# Patient Record
Sex: Male | Born: 1965 | Race: Black or African American | Hispanic: No | Marital: Single | Smoking: Current every day smoker
Health system: Southern US, Community
[De-identification: ages and names within clinical notes are randomized; demographics above are authoritative.]

## PROBLEM LIST (undated history)

## (undated) DIAGNOSIS — I10 Essential (primary) hypertension: Secondary | ICD-10-CM

## (undated) DIAGNOSIS — F319 Bipolar disorder, unspecified: Secondary | ICD-10-CM

## (undated) DIAGNOSIS — F4310 Post-traumatic stress disorder, unspecified: Secondary | ICD-10-CM

## (undated) DIAGNOSIS — K859 Acute pancreatitis without necrosis or infection, unspecified: Secondary | ICD-10-CM

## (undated) HISTORY — PX: CHOLECYSTECTOMY: SHX55

## (undated) HISTORY — PX: PANCREAS SURGERY: SHX731

---

## 2001-04-10 ENCOUNTER — Inpatient Hospital Stay (HOSPITAL_COMMUNITY): Admission: EM | Admit: 2001-04-10 | Discharge: 2001-04-14 | Payer: Self-pay | Admitting: Emergency Medicine

## 2001-04-10 ENCOUNTER — Encounter: Payer: Self-pay | Admitting: Emergency Medicine

## 2003-10-11 ENCOUNTER — Other Ambulatory Visit: Payer: Self-pay

## 2004-02-12 ENCOUNTER — Other Ambulatory Visit: Payer: Self-pay

## 2009-03-10 ENCOUNTER — Emergency Department: Payer: Self-pay | Admitting: Emergency Medicine

## 2009-05-08 ENCOUNTER — Emergency Department: Payer: Self-pay | Admitting: Emergency Medicine

## 2009-12-13 ENCOUNTER — Inpatient Hospital Stay: Payer: Self-pay | Admitting: Internal Medicine

## 2010-01-07 ENCOUNTER — Emergency Department: Payer: Self-pay | Admitting: Unknown Physician Specialty

## 2010-01-20 DIAGNOSIS — F1011 Alcohol abuse, in remission: Secondary | ICD-10-CM | POA: Insufficient documentation

## 2010-01-20 DIAGNOSIS — F1410 Cocaine abuse, uncomplicated: Secondary | ICD-10-CM | POA: Insufficient documentation

## 2011-11-10 ENCOUNTER — Inpatient Hospital Stay: Payer: Self-pay | Admitting: Student

## 2011-11-10 LAB — URINALYSIS, COMPLETE
Bacteria: NONE SEEN
Bilirubin,UR: NEGATIVE
Blood: NEGATIVE
Glucose,UR: NEGATIVE mg/dL (ref 0–75)
Leukocyte Esterase: NEGATIVE
Nitrite: NEGATIVE
Ph: 6 (ref 4.5–8.0)
Protein: 30
RBC,UR: 9 /HPF (ref 0–5)
Specific Gravity: 1.021 (ref 1.003–1.030)
Squamous Epithelial: 1
WBC UR: 2 /HPF (ref 0–5)

## 2011-11-10 LAB — COMPREHENSIVE METABOLIC PANEL
Albumin: 3.9 g/dL (ref 3.4–5.0)
Alkaline Phosphatase: 85 U/L (ref 50–136)
Anion Gap: 10 (ref 7–16)
BUN: 8 mg/dL (ref 7–18)
Bilirubin,Total: 0.7 mg/dL (ref 0.2–1.0)
Calcium, Total: 10.1 mg/dL (ref 8.5–10.1)
Chloride: 92 mmol/L — ABNORMAL LOW (ref 98–107)
Co2: 26 mmol/L (ref 21–32)
Creatinine: 1.04 mg/dL (ref 0.60–1.30)
EGFR (African American): >60
EGFR (Non-African Amer.): >60
Glucose: 112 mg/dL — ABNORMAL HIGH (ref 65–99)
Osmolality: 256 (ref 275–301)
Potassium: 3.3 mmol/L — ABNORMAL LOW (ref 3.5–5.1)
SGOT(AST): 16 U/L (ref 15–37)
SGPT (ALT): 18 U/L
Sodium: 128 mmol/L — ABNORMAL LOW (ref 136–145)
Total Protein: 8.1 g/dL (ref 6.4–8.2)

## 2011-11-10 LAB — CBC
HCT: 43.7 % (ref 40.0–52.0)
HGB: 14.4 g/dL (ref 13.0–18.0)
MCH: 29.9 pg (ref 26.0–34.0)
MCHC: 33 g/dL (ref 32.0–36.0)
MCV: 91 fL (ref 80–100)
Platelet: 292 10*3/uL (ref 150–440)
RBC: 4.82 10*6/uL (ref 4.40–5.90)
RDW: 13.2 % (ref 11.5–14.5)
WBC: 14.6 10*3/uL — ABNORMAL HIGH (ref 3.8–10.6)

## 2011-11-10 LAB — LIPASE, BLOOD: Lipase: 160 U/L (ref 73–393)

## 2011-11-11 LAB — DRUG SCREEN, URINE
Amphetamines, Ur Screen: NEGATIVE (ref ?–1000)
Barbiturates, Ur Screen: NEGATIVE (ref ?–200)
Benzodiazepine, Ur Scrn: NEGATIVE (ref ?–200)
Cannabinoid 50 Ng, Ur ~~LOC~~: POSITIVE (ref ?–50)
Cocaine Metabolite,Ur ~~LOC~~: POSITIVE (ref ?–300)
MDMA (Ecstasy)Ur Screen: NEGATIVE (ref ?–500)
Methadone, Ur Screen: NEGATIVE (ref ?–300)
Opiate, Ur Screen: POSITIVE (ref ?–300)
Phencyclidine (PCP) Ur S: NEGATIVE (ref ?–25)
Tricyclic, Ur Screen: NEGATIVE (ref ?–1000)

## 2011-11-11 LAB — CBC WITH DIFFERENTIAL/PLATELET
Basophil #: 0.1 10*3/uL (ref 0.0–0.1)
Basophil %: 0.4 %
Eosinophil #: 0.3 10*3/uL (ref 0.0–0.7)
Eosinophil %: 2.6 %
HCT: 42 % (ref 40.0–52.0)
HGB: 13.9 g/dL (ref 13.0–18.0)
Lymphocyte #: 2.8 10*3/uL (ref 1.0–3.6)
Lymphocyte %: 21.7 %
MCH: 29.6 pg (ref 26.0–34.0)
MCHC: 33.1 g/dL (ref 32.0–36.0)
MCV: 90 fL (ref 80–100)
Monocyte #: 1.2 x10 3/mm — ABNORMAL HIGH (ref 0.2–1.0)
Monocyte %: 9 %
Neutrophil #: 8.6 10*3/uL — ABNORMAL HIGH (ref 1.4–6.5)
Neutrophil %: 66.3 %
Platelet: 279 10*3/uL (ref 150–440)
RBC: 4.69 10*6/uL (ref 4.40–5.90)
RDW: 13.2 % (ref 11.5–14.5)
WBC: 13 10*3/uL — ABNORMAL HIGH (ref 3.8–10.6)

## 2011-11-11 LAB — MAGNESIUM: Magnesium: 2 mg/dL

## 2011-11-11 LAB — AMYLASE: Amylase: 94 U/L (ref 25–115)

## 2011-11-11 LAB — COMPREHENSIVE METABOLIC PANEL
Albumin: 3.4 g/dL (ref 3.4–5.0)
Alkaline Phosphatase: 77 U/L (ref 50–136)
Anion Gap: 13 (ref 7–16)
BUN: 9 mg/dL (ref 7–18)
Bilirubin,Total: 0.6 mg/dL (ref 0.2–1.0)
Calcium, Total: 9.6 mg/dL (ref 8.5–10.1)
Chloride: 96 mmol/L — ABNORMAL LOW (ref 98–107)
Co2: 25 mmol/L (ref 21–32)
Creatinine: 1.15 mg/dL (ref 0.60–1.30)
EGFR (African American): >60
EGFR (Non-African Amer.): >60
Glucose: 94 mg/dL (ref 65–99)
Osmolality: 267 (ref 275–301)
Potassium: 2.9 mmol/L — ABNORMAL LOW (ref 3.5–5.1)
SGOT(AST): 14 U/L — ABNORMAL LOW (ref 15–37)
SGPT (ALT): 15 U/L
Sodium: 134 mmol/L — ABNORMAL LOW (ref 136–145)
Total Protein: 7.3 g/dL (ref 6.4–8.2)

## 2011-11-11 LAB — LIPASE, BLOOD: Lipase: 149 U/L (ref 73–393)

## 2011-11-12 LAB — BASIC METABOLIC PANEL
Anion Gap: 7 (ref 7–16)
BUN: 10 mg/dL (ref 7–18)
Calcium, Total: 8.8 mg/dL (ref 8.5–10.1)
Chloride: 107 mmol/L (ref 98–107)
Co2: 24 mmol/L (ref 21–32)
Creatinine: 1.18 mg/dL (ref 0.60–1.30)
EGFR (African American): >60
EGFR (Non-African Amer.): >60
Glucose: 100 mg/dL — ABNORMAL HIGH (ref 65–99)
Osmolality: 275 (ref 275–301)
Potassium: 4.4 mmol/L (ref 3.5–5.1)
Sodium: 138 mmol/L (ref 136–145)

## 2013-01-25 DIAGNOSIS — F1910 Other psychoactive substance abuse, uncomplicated: Secondary | ICD-10-CM | POA: Insufficient documentation

## 2013-01-25 DIAGNOSIS — G894 Chronic pain syndrome: Secondary | ICD-10-CM | POA: Insufficient documentation

## 2013-01-25 DIAGNOSIS — K219 Gastro-esophageal reflux disease without esophagitis: Secondary | ICD-10-CM | POA: Insufficient documentation

## 2013-04-05 ENCOUNTER — Inpatient Hospital Stay: Payer: Self-pay | Admitting: Student

## 2013-04-05 LAB — URINALYSIS, COMPLETE
Bacteria: NONE SEEN
Bilirubin,UR: NEGATIVE
Blood: NEGATIVE
Glucose,UR: NEGATIVE mg/dL (ref 0–75)
Leukocyte Esterase: NEGATIVE
Nitrite: NEGATIVE
Ph: 5 (ref 4.5–8.0)
Protein: 30
RBC,UR: 3 /HPF (ref 0–5)
Specific Gravity: 1.026 (ref 1.003–1.030)
Squamous Epithelial: 1
WBC UR: 1 /HPF (ref 0–5)

## 2013-04-05 LAB — CBC
HCT: 40.7 % (ref 40.0–52.0)
HGB: 13.3 g/dL (ref 13.0–18.0)
MCH: 26 pg (ref 26.0–34.0)
MCHC: 32.7 g/dL (ref 32.0–36.0)
MCV: 80 fL (ref 80–100)
Platelet: 202 10*3/uL (ref 150–440)
RBC: 5.12 10*6/uL (ref 4.40–5.90)
RDW: 21.2 % — ABNORMAL HIGH (ref 11.5–14.5)
WBC: 9.2 10*3/uL (ref 3.8–10.6)

## 2013-04-05 LAB — APTT
Activated PTT: 27.7 secs (ref 23.6–35.9)
Activated PTT: 48.1 secs — ABNORMAL HIGH (ref 23.6–35.9)

## 2013-04-05 LAB — COMPREHENSIVE METABOLIC PANEL
Albumin: 3.1 g/dL — ABNORMAL LOW (ref 3.4–5.0)
Alkaline Phosphatase: 87 U/L (ref 50–136)
Anion Gap: 6 — ABNORMAL LOW (ref 7–16)
BUN: 7 mg/dL (ref 7–18)
Bilirubin,Total: 0.3 mg/dL (ref 0.2–1.0)
Calcium, Total: 8.7 mg/dL (ref 8.5–10.1)
Chloride: 109 mmol/L — ABNORMAL HIGH (ref 98–107)
Co2: 24 mmol/L (ref 21–32)
Creatinine: 1.49 mg/dL — ABNORMAL HIGH (ref 0.60–1.30)
EGFR (African American): >60
EGFR (Non-African Amer.): 54 — ABNORMAL LOW
Glucose: 113 mg/dL — ABNORMAL HIGH (ref 65–99)
Osmolality: 276 (ref 275–301)
Potassium: 3.2 mmol/L — ABNORMAL LOW (ref 3.5–5.1)
SGOT(AST): 23 U/L (ref 15–37)
SGPT (ALT): 25 U/L (ref 12–78)
Sodium: 139 mmol/L (ref 136–145)
Total Protein: 6.7 g/dL (ref 6.4–8.2)

## 2013-04-05 LAB — DRUG SCREEN, URINE
Amphetamines, Ur Screen: NEGATIVE (ref ?–1000)
Barbiturates, Ur Screen: NEGATIVE (ref ?–200)
Benzodiazepine, Ur Scrn: NEGATIVE (ref ?–200)
Cannabinoid 50 Ng, Ur ~~LOC~~: POSITIVE (ref ?–50)
Cocaine Metabolite,Ur ~~LOC~~: POSITIVE (ref ?–300)
MDMA (Ecstasy)Ur Screen: NEGATIVE (ref ?–500)
Methadone, Ur Screen: NEGATIVE (ref ?–300)
Opiate, Ur Screen: NEGATIVE (ref ?–300)
Phencyclidine (PCP) Ur S: NEGATIVE (ref ?–25)
Tricyclic, Ur Screen: NEGATIVE (ref ?–1000)

## 2013-04-05 LAB — CK TOTAL AND CKMB (NOT AT ARMC)
CK, Total: 72 U/L (ref 35–232)
CK, Total: 58 U/L (ref 35–232)
CK-MB: 0.9 ng/mL (ref 0.5–3.6)
CK-MB: 1 ng/mL (ref 0.5–3.6)

## 2013-04-05 LAB — PROTIME-INR
INR: 0.9
Prothrombin Time: 12.6 secs (ref 11.5–14.7)

## 2013-04-05 LAB — LIPASE, BLOOD: Lipase: 160 U/L (ref 73–393)

## 2013-04-05 LAB — TROPONIN I
Troponin-I: 0.12 ng/mL — ABNORMAL HIGH
Troponin-I: 0.12 ng/mL — ABNORMAL HIGH

## 2013-04-06 LAB — CK TOTAL AND CKMB (NOT AT ARMC)
CK, Total: 48 U/L (ref 35–232)
CK-MB: 0.8 ng/mL (ref 0.5–3.6)

## 2013-04-06 LAB — CREATININE, SERUM
Creatinine: 1.42 mg/dL — ABNORMAL HIGH (ref 0.60–1.30)
Creatinine: 1.34 mg/dL — ABNORMAL HIGH (ref 0.60–1.30)
EGFR (African American): >60
EGFR (African American): >60
EGFR (Non-African Amer.): 58 — ABNORMAL LOW
EGFR (Non-African Amer.): >60

## 2013-04-06 LAB — LIPID PANEL
Cholesterol: 142 mg/dL (ref 0–200)
HDL Cholesterol: 97 mg/dL — ABNORMAL HIGH (ref 40–60)
Ldl Cholesterol, Calc: 30 mg/dL (ref 0–100)
Triglycerides: 77 mg/dL (ref 0–200)
VLDL Cholesterol, Calc: 15 mg/dL (ref 5–40)

## 2013-04-06 LAB — APTT
Activated PTT: 44.8 secs — ABNORMAL HIGH (ref 23.6–35.9)
Activated PTT: 27.9 secs (ref 23.6–35.9)

## 2013-04-06 LAB — TROPONIN I: Troponin-I: 0.12 ng/mL — ABNORMAL HIGH

## 2013-11-22 ENCOUNTER — Emergency Department: Payer: Self-pay | Admitting: Emergency Medicine

## 2013-11-22 LAB — DRUG SCREEN, URINE
Amphetamines, Ur Screen: NEGATIVE (ref ?–1000)
Barbiturates, Ur Screen: NEGATIVE (ref ?–200)
Benzodiazepine, Ur Scrn: NEGATIVE (ref ?–200)
Cannabinoid 50 Ng, Ur ~~LOC~~: POSITIVE (ref ?–50)
Cocaine Metabolite,Ur ~~LOC~~: POSITIVE (ref ?–300)
MDMA (Ecstasy)Ur Screen: NEGATIVE (ref ?–500)
Methadone, Ur Screen: NEGATIVE (ref ?–300)
Opiate, Ur Screen: NEGATIVE (ref ?–300)
Phencyclidine (PCP) Ur S: NEGATIVE (ref ?–25)
Tricyclic, Ur Screen: NEGATIVE (ref ?–1000)

## 2013-11-22 LAB — COMPREHENSIVE METABOLIC PANEL
Albumin: 3.5 g/dL (ref 3.4–5.0)
Alkaline Phosphatase: 82 U/L
Anion Gap: 3 — ABNORMAL LOW (ref 7–16)
BUN: 11 mg/dL (ref 7–18)
Bilirubin,Total: 0.4 mg/dL (ref 0.2–1.0)
Calcium, Total: 8.9 mg/dL (ref 8.5–10.1)
Chloride: 115 mmol/L — ABNORMAL HIGH (ref 98–107)
Co2: 23 mmol/L (ref 21–32)
Creatinine: 1.52 mg/dL — ABNORMAL HIGH (ref 0.60–1.30)
EGFR (African American): >60
EGFR (Non-African Amer.): 53 — ABNORMAL LOW
Glucose: 84 mg/dL (ref 65–99)
Osmolality: 280 (ref 275–301)
Potassium: 4.5 mmol/L (ref 3.5–5.1)
SGOT(AST): 40 U/L — ABNORMAL HIGH (ref 15–37)
SGPT (ALT): 29 U/L (ref 12–78)
Sodium: 141 mmol/L (ref 136–145)
Total Protein: 7.5 g/dL (ref 6.4–8.2)

## 2013-11-22 LAB — CBC
HCT: 48.1 % (ref 40.0–52.0)
HGB: 15.5 g/dL (ref 13.0–18.0)
MCH: 30.3 pg (ref 26.0–34.0)
MCHC: 32.2 g/dL (ref 32.0–36.0)
MCV: 94 fL (ref 80–100)
Platelet: 196 10*3/uL (ref 150–440)
RBC: 5.12 10*6/uL (ref 4.40–5.90)
RDW: 16.8 % — ABNORMAL HIGH (ref 11.5–14.5)
WBC: 6.6 10*3/uL (ref 3.8–10.6)

## 2013-11-22 LAB — URINALYSIS, COMPLETE
Bacteria: NONE SEEN
Bilirubin,UR: NEGATIVE
Blood: NEGATIVE
Glucose,UR: NEGATIVE mg/dL (ref 0–75)
Hyaline Cast: 2
Ketone: NEGATIVE
Leukocyte Esterase: NEGATIVE
Nitrite: NEGATIVE
Ph: 5 (ref 4.5–8.0)
Protein: NEGATIVE
RBC,UR: 1 /HPF (ref 0–5)
Specific Gravity: 1.018 (ref 1.003–1.030)
Squamous Epithelial: <1
WBC UR: <1 /HPF (ref 0–5)

## 2013-11-22 LAB — ETHANOL
Ethanol %: <0.003 % (ref 0.000–0.080)
Ethanol: <3 mg/dL

## 2013-11-22 LAB — ACETAMINOPHEN LEVEL: Acetaminophen: <2 ug/mL

## 2013-11-22 LAB — SALICYLATE LEVEL: Salicylates, Serum: 3 mg/dL — ABNORMAL HIGH

## 2013-11-22 LAB — LIPASE, BLOOD: Lipase: 133 U/L (ref 73–393)

## 2013-12-18 LAB — COMPREHENSIVE METABOLIC PANEL
Albumin: 3.4 g/dL (ref 3.4–5.0)
Alkaline Phosphatase: 83 U/L
Anion Gap: 8 (ref 7–16)
BUN: 13 mg/dL (ref 7–18)
Bilirubin,Total: 0.3 mg/dL (ref 0.2–1.0)
Calcium, Total: 8.9 mg/dL (ref 8.5–10.1)
Chloride: 110 mmol/L — ABNORMAL HIGH (ref 98–107)
Co2: 25 mmol/L (ref 21–32)
Creatinine: 1.55 mg/dL — ABNORMAL HIGH (ref 0.60–1.30)
EGFR (African American): >60
EGFR (Non-African Amer.): 52 — ABNORMAL LOW
Glucose: 109 mg/dL — ABNORMAL HIGH (ref 65–99)
Osmolality: 286 (ref 275–301)
Potassium: 3.5 mmol/L (ref 3.5–5.1)
SGOT(AST): 36 U/L (ref 15–37)
SGPT (ALT): 37 U/L (ref 12–78)
Sodium: 143 mmol/L (ref 136–145)
Total Protein: 6.8 g/dL (ref 6.4–8.2)

## 2013-12-18 LAB — URINALYSIS, COMPLETE
Bacteria: NONE SEEN
Bilirubin,UR: NEGATIVE
Blood: NEGATIVE
Glucose,UR: NEGATIVE mg/dL (ref 0–75)
Ketone: NEGATIVE
Leukocyte Esterase: NEGATIVE
Nitrite: NEGATIVE
Ph: 5 (ref 4.5–8.0)
Protein: NEGATIVE
RBC,UR: <1 /HPF (ref 0–5)
Specific Gravity: 1.018 (ref 1.003–1.030)
Squamous Epithelial: NONE SEEN
WBC UR: 1 /HPF (ref 0–5)

## 2013-12-18 LAB — DRUG SCREEN, URINE
Amphetamines, Ur Screen: NEGATIVE (ref ?–1000)
Barbiturates, Ur Screen: POSITIVE (ref ?–200)
Benzodiazepine, Ur Scrn: NEGATIVE (ref ?–200)
Cannabinoid 50 Ng, Ur ~~LOC~~: POSITIVE (ref ?–50)
Cocaine Metabolite,Ur ~~LOC~~: POSITIVE (ref ?–300)
MDMA (Ecstasy)Ur Screen: NEGATIVE (ref ?–500)
Methadone, Ur Screen: NEGATIVE (ref ?–300)
Opiate, Ur Screen: NEGATIVE (ref ?–300)
Phencyclidine (PCP) Ur S: NEGATIVE (ref ?–25)
Tricyclic, Ur Screen: NEGATIVE (ref ?–1000)

## 2013-12-18 LAB — CBC
HCT: 41.7 % (ref 40.0–52.0)
HGB: 13.4 g/dL (ref 13.0–18.0)
MCH: 29.5 pg (ref 26.0–34.0)
MCHC: 32 g/dL (ref 32.0–36.0)
MCV: 92 fL (ref 80–100)
Platelet: 196 10*3/uL (ref 150–440)
RBC: 4.52 10*6/uL (ref 4.40–5.90)
RDW: 15.8 % — ABNORMAL HIGH (ref 11.5–14.5)
WBC: 7.4 10*3/uL (ref 3.8–10.6)

## 2013-12-18 LAB — ETHANOL
Ethanol %: <0.003 % (ref 0.000–0.080)
Ethanol: <3 mg/dL

## 2013-12-18 LAB — ACETAMINOPHEN LEVEL: Acetaminophen: <2 ug/mL

## 2013-12-18 LAB — LIPASE, BLOOD: Lipase: 171 U/L (ref 73–393)

## 2013-12-18 LAB — SALICYLATE LEVEL: Salicylates, Serum: 2.6 mg/dL

## 2013-12-19 ENCOUNTER — Inpatient Hospital Stay: Payer: Self-pay | Admitting: Psychiatry

## 2013-12-21 LAB — BASIC METABOLIC PANEL
Anion Gap: 5 — ABNORMAL LOW (ref 7–16)
BUN: 17 mg/dL (ref 7–18)
Calcium, Total: 10.6 mg/dL — ABNORMAL HIGH (ref 8.5–10.1)
Chloride: 97 mmol/L — ABNORMAL LOW (ref 98–107)
Co2: 31 mmol/L (ref 21–32)
Creatinine: 1.34 mg/dL — ABNORMAL HIGH (ref 0.60–1.30)
EGFR (African American): >60
EGFR (Non-African Amer.): >60
Glucose: 89 mg/dL (ref 65–99)
Osmolality: 267 (ref 275–301)
Potassium: 5.6 mmol/L — ABNORMAL HIGH (ref 3.5–5.1)
Sodium: 133 mmol/L — ABNORMAL LOW (ref 136–145)

## 2013-12-22 LAB — BASIC METABOLIC PANEL
Anion Gap: 8 (ref 7–16)
BUN: 17 mg/dL (ref 7–18)
Calcium, Total: 10.3 mg/dL — ABNORMAL HIGH (ref 8.5–10.1)
Chloride: 99 mmol/L (ref 98–107)
Co2: 31 mmol/L (ref 21–32)
Creatinine: 1.53 mg/dL — ABNORMAL HIGH (ref 0.60–1.30)
EGFR (African American): >60
EGFR (Non-African Amer.): 53 — ABNORMAL LOW
Glucose: 157 mg/dL — ABNORMAL HIGH (ref 65–99)
Osmolality: 280 (ref 275–301)
Potassium: 4.4 mmol/L (ref 3.5–5.1)
Sodium: 138 mmol/L (ref 136–145)

## 2013-12-22 LAB — LIPASE, BLOOD: Lipase: 204 U/L (ref 73–393)

## 2014-04-19 ENCOUNTER — Emergency Department: Payer: Self-pay | Admitting: Emergency Medicine

## 2014-04-19 LAB — COMPREHENSIVE METABOLIC PANEL
Albumin: 3.4 g/dL (ref 3.4–5.0)
Alkaline Phosphatase: 91 U/L
Anion Gap: 10 (ref 7–16)
BUN: 16 mg/dL (ref 7–18)
Bilirubin,Total: 0.8 mg/dL (ref 0.2–1.0)
Calcium, Total: 8.1 mg/dL — ABNORMAL LOW (ref 8.5–10.1)
Chloride: 110 mmol/L — ABNORMAL HIGH (ref 98–107)
Co2: 22 mmol/L (ref 21–32)
Creatinine: 1.62 mg/dL — ABNORMAL HIGH (ref 0.60–1.30)
EGFR (African American): 58 — ABNORMAL LOW
EGFR (Non-African Amer.): 48 — ABNORMAL LOW
Glucose: 118 mg/dL — ABNORMAL HIGH (ref 65–99)
Osmolality: 285 (ref 275–301)
Potassium: 4.2 mmol/L (ref 3.5–5.1)
SGOT(AST): 43 U/L — ABNORMAL HIGH (ref 15–37)
SGPT (ALT): 40 U/L
Sodium: 142 mmol/L (ref 136–145)
Total Protein: 7.4 g/dL (ref 6.4–8.2)

## 2014-04-19 LAB — CBC
HCT: 42.6 % (ref 40.0–52.0)
HGB: 13.6 g/dL (ref 13.0–18.0)
MCH: 28 pg (ref 26.0–34.0)
MCHC: 31.9 g/dL — ABNORMAL LOW (ref 32.0–36.0)
MCV: 88 fL (ref 80–100)
Platelet: 217 10*3/uL (ref 150–440)
RBC: 4.85 10*6/uL (ref 4.40–5.90)
RDW: 15 % — ABNORMAL HIGH (ref 11.5–14.5)
WBC: 7 10*3/uL (ref 3.8–10.6)

## 2014-04-19 LAB — ACETAMINOPHEN LEVEL: Acetaminophen: <2 ug/mL

## 2014-04-19 LAB — ETHANOL: Ethanol: 156 mg/dL

## 2014-04-19 LAB — SALICYLATE LEVEL: Salicylates, Serum: <1.7 mg/dL

## 2014-04-20 LAB — URINALYSIS, COMPLETE
Bacteria: NONE SEEN
Bilirubin,UR: NEGATIVE
Blood: NEGATIVE
Glucose,UR: NEGATIVE mg/dL (ref 0–75)
Ketone: NEGATIVE
Leukocyte Esterase: NEGATIVE
Nitrite: NEGATIVE
Ph: 5 (ref 4.5–8.0)
Protein: NEGATIVE
RBC,UR: 1 /HPF (ref 0–5)
Specific Gravity: 1.019 (ref 1.003–1.030)
Squamous Epithelial: 1
WBC UR: 1 /HPF (ref 0–5)

## 2014-04-20 LAB — DRUG SCREEN, URINE
Amphetamines, Ur Screen: NEGATIVE (ref ?–1000)
Barbiturates, Ur Screen: NEGATIVE (ref ?–200)
Benzodiazepine, Ur Scrn: NEGATIVE (ref ?–200)
Cannabinoid 50 Ng, Ur ~~LOC~~: POSITIVE (ref ?–50)
Cocaine Metabolite,Ur ~~LOC~~: POSITIVE (ref ?–300)
MDMA (Ecstasy)Ur Screen: NEGATIVE (ref ?–500)
Methadone, Ur Screen: NEGATIVE (ref ?–300)
Opiate, Ur Screen: NEGATIVE (ref ?–300)
Phencyclidine (PCP) Ur S: NEGATIVE (ref ?–25)
Tricyclic, Ur Screen: NEGATIVE (ref ?–1000)

## 2014-05-03 ENCOUNTER — Inpatient Hospital Stay: Payer: Self-pay | Admitting: Psychiatry

## 2014-05-03 LAB — DRUG SCREEN, URINE
Amphetamines, Ur Screen: NEGATIVE (ref ?–1000)
Barbiturates, Ur Screen: NEGATIVE (ref ?–200)
Benzodiazepine, Ur Scrn: NEGATIVE (ref ?–200)
Cannabinoid 50 Ng, Ur ~~LOC~~: POSITIVE (ref ?–50)
Cocaine Metabolite,Ur ~~LOC~~: POSITIVE (ref ?–300)
MDMA (Ecstasy)Ur Screen: NEGATIVE (ref ?–500)
Methadone, Ur Screen: NEGATIVE (ref ?–300)
Opiate, Ur Screen: NEGATIVE (ref ?–300)
Phencyclidine (PCP) Ur S: NEGATIVE (ref ?–25)
Tricyclic, Ur Screen: NEGATIVE (ref ?–1000)

## 2014-05-03 LAB — URINALYSIS, COMPLETE
Bacteria: NONE SEEN
Bilirubin,UR: NEGATIVE
Blood: NEGATIVE
Glucose,UR: NEGATIVE mg/dL (ref 0–75)
Hyaline Cast: 5
Leukocyte Esterase: NEGATIVE
Nitrite: NEGATIVE
Ph: 5 (ref 4.5–8.0)
Protein: 30
RBC,UR: <1 /HPF (ref 0–5)
Specific Gravity: 1.019 (ref 1.003–1.030)
Squamous Epithelial: 1
WBC UR: 2 /HPF (ref 0–5)

## 2014-05-03 LAB — CBC
HCT: 45.9 % (ref 40.0–52.0)
HGB: 14.4 g/dL (ref 13.0–18.0)
MCH: 27.8 pg (ref 26.0–34.0)
MCHC: 31.3 g/dL — ABNORMAL LOW (ref 32.0–36.0)
MCV: 89 fL (ref 80–100)
Platelet: 251 10*3/uL (ref 150–440)
RBC: 5.17 10*6/uL (ref 4.40–5.90)
RDW: 15.5 % — ABNORMAL HIGH (ref 11.5–14.5)
WBC: 13.7 10*3/uL — ABNORMAL HIGH (ref 3.8–10.6)

## 2014-05-03 LAB — COMPREHENSIVE METABOLIC PANEL
Albumin: 3.7 g/dL (ref 3.4–5.0)
Alkaline Phosphatase: 110 U/L
Anion Gap: 9 (ref 7–16)
BUN: 14 mg/dL (ref 7–18)
Bilirubin,Total: 0.4 mg/dL (ref 0.2–1.0)
Calcium, Total: 8.6 mg/dL (ref 8.5–10.1)
Chloride: 109 mmol/L — ABNORMAL HIGH (ref 98–107)
Co2: 21 mmol/L (ref 21–32)
Creatinine: 1.62 mg/dL — ABNORMAL HIGH (ref 0.60–1.30)
EGFR (African American): 58 — ABNORMAL LOW
EGFR (Non-African Amer.): 48 — ABNORMAL LOW
Glucose: 79 mg/dL (ref 65–99)
Osmolality: 277 (ref 275–301)
Potassium: 4 mmol/L (ref 3.5–5.1)
SGOT(AST): 47 U/L — ABNORMAL HIGH (ref 15–37)
SGPT (ALT): 39 U/L
Sodium: 139 mmol/L (ref 136–145)
Total Protein: 8.3 g/dL — ABNORMAL HIGH (ref 6.4–8.2)

## 2014-05-03 LAB — ETHANOL: Ethanol: <3 mg/dL

## 2014-05-03 LAB — ACETAMINOPHEN LEVEL: Acetaminophen: <2 ug/mL

## 2014-05-03 LAB — SALICYLATE LEVEL: Salicylates, Serum: 2.2 mg/dL

## 2014-05-05 LAB — CBC WITH DIFFERENTIAL/PLATELET
Basophil #: 0 10*3/uL (ref 0.0–0.1)
Basophil %: 0.7 %
Eosinophil #: 0.3 10*3/uL (ref 0.0–0.7)
Eosinophil %: 4.3 %
HCT: 42.4 % (ref 40.0–52.0)
HGB: 13.4 g/dL (ref 13.0–18.0)
Lymphocyte #: 1.8 10*3/uL (ref 1.0–3.6)
Lymphocyte %: 25.9 %
MCH: 28.1 pg (ref 26.0–34.0)
MCHC: 31.7 g/dL — ABNORMAL LOW (ref 32.0–36.0)
MCV: 89 fL (ref 80–100)
Monocyte #: 0.9 x10 3/mm (ref 0.2–1.0)
Monocyte %: 13.3 %
Neutrophil #: 3.9 10*3/uL (ref 1.4–6.5)
Neutrophil %: 55.8 %
Platelet: 212 10*3/uL (ref 150–440)
RBC: 4.78 10*6/uL (ref 4.40–5.90)
RDW: 16.2 % — ABNORMAL HIGH (ref 11.5–14.5)
WBC: 7 10*3/uL (ref 3.8–10.6)

## 2014-06-15 ENCOUNTER — Emergency Department: Payer: Self-pay | Admitting: Emergency Medicine

## 2014-06-15 LAB — COMPREHENSIVE METABOLIC PANEL
Albumin: 4.1 g/dL (ref 3.4–5.0)
Alkaline Phosphatase: 93 U/L
Anion Gap: 13 (ref 7–16)
BUN: 18 mg/dL (ref 7–18)
Bilirubin,Total: 0.5 mg/dL (ref 0.2–1.0)
Calcium, Total: 8.5 mg/dL (ref 8.5–10.1)
Chloride: 101 mmol/L (ref 98–107)
Co2: 24 mmol/L (ref 21–32)
Creatinine: 1.69 mg/dL — ABNORMAL HIGH (ref 0.60–1.30)
EGFR (African American): 56 — ABNORMAL LOW
EGFR (Non-African Amer.): 46 — ABNORMAL LOW
Glucose: 75 mg/dL (ref 65–99)
Osmolality: 276 (ref 275–301)
Potassium: 4.1 mmol/L (ref 3.5–5.1)
SGOT(AST): 48 U/L — ABNORMAL HIGH (ref 15–37)
SGPT (ALT): 33 U/L
Sodium: 138 mmol/L (ref 136–145)
Total Protein: 8.6 g/dL — ABNORMAL HIGH (ref 6.4–8.2)

## 2014-06-15 LAB — CBC
HCT: 47.6 % (ref 40.0–52.0)
HGB: 15.1 g/dL (ref 13.0–18.0)
MCH: 27.6 pg (ref 26.0–34.0)
MCHC: 31.6 g/dL — ABNORMAL LOW (ref 32.0–36.0)
MCV: 87 fL (ref 80–100)
Platelet: 248 10*3/uL (ref 150–440)
RBC: 5.46 10*6/uL (ref 4.40–5.90)
RDW: 18.7 % — ABNORMAL HIGH (ref 11.5–14.5)
WBC: 8.8 10*3/uL (ref 3.8–10.6)

## 2014-06-15 LAB — ACETAMINOPHEN LEVEL: Acetaminophen: <2 ug/mL

## 2014-06-15 LAB — ETHANOL: Ethanol: 176 mg/dL

## 2014-06-15 LAB — SALICYLATE LEVEL: Salicylates, Serum: 2.3 mg/dL

## 2014-06-16 LAB — URINALYSIS, COMPLETE
Bacteria: NONE SEEN
Bilirubin,UR: NEGATIVE
Glucose,UR: NEGATIVE mg/dL (ref 0–75)
Ketone: NEGATIVE
Leukocyte Esterase: NEGATIVE
Nitrite: NEGATIVE
Ph: 5 (ref 4.5–8.0)
Protein: NEGATIVE
RBC,UR: <1 /HPF (ref 0–5)
Specific Gravity: 1.011 (ref 1.003–1.030)
Squamous Epithelial: 1
WBC UR: 1 /HPF (ref 0–5)

## 2014-06-16 LAB — DRUG SCREEN, URINE
Amphetamines, Ur Screen: NEGATIVE (ref ?–1000)
Barbiturates, Ur Screen: NEGATIVE (ref ?–200)
Benzodiazepine, Ur Scrn: NEGATIVE (ref ?–200)
Cannabinoid 50 Ng, Ur ~~LOC~~: POSITIVE (ref ?–50)
Cocaine Metabolite,Ur ~~LOC~~: POSITIVE (ref ?–300)
MDMA (Ecstasy)Ur Screen: NEGATIVE (ref ?–500)
Methadone, Ur Screen: NEGATIVE (ref ?–300)
Opiate, Ur Screen: NEGATIVE (ref ?–300)
Phencyclidine (PCP) Ur S: NEGATIVE (ref ?–25)
Tricyclic, Ur Screen: NEGATIVE (ref ?–1000)

## 2014-10-04 NOTE — Discharge Summary (Signed)
PATIENT NAME:  Walter [name], Walter [name] MR#:  [id number] DATE OF BIRTH:  [birth year]  DATE OF ADMISSION:  04/05/2013 DATE OF DISCHARGE:  04/07/2013.   CONSULTANT:  Dr. Adrian Blackwater from Cardiology.   PRIMARY CARE PHYSICIAN: At [facility].   PRIMARY GASTROENTEROLOGY:  At [facility].   CHIEF COMPLAINT:  Epigastric pain.   DISCHARGE DIAGNOSES:  1.  Acute coronary spasm, likely induced from cocaine use.  2.  Chronic pancreatitis and chronic abdominal pain.  3.  Acute mild renal failure.  4.  Hyperkalemia.  5.  A history of tobacco abuse.  6.  A history of alcohol and illicit drug use.  7.  Cocaine use.  8.  A history of methadone use.  9.  Hypertension.  DISCHARGE MEDICATIONS.  1.  Methadone 10 mg 4 tabs every 6 hours.  2.  Chlorthalidone 25 mg once a day.  3.  Percocet 5/325 1 tab 2 times a day as needed, x 10 tablets.  4.  Aspirin 81 mg daily. 5.  Please resume your pancrelipase as previously prescribed and I discussed with you.    DIET:  Low sodium, low fat, low cholesterol.   ACTIVITY:  As tolerated.   DISCHARGE INSTRUCTIONS: 1.  Please follow with PCP within 1 to 2 weeks.  4.  Please follow with Dr. Welton Flakes on Tuesday at 11:00 a.m.   HISTORY OF PRESENT ILLNESS AND HOSPITAL COURSE:  For full details of H and P, please see the dictation on 10/23 by Dr. Juliene Pina, but briefly, this is a 49 year old African American male with a history of chronic pancreatitis, hypertension, who came in with epigastric pain radiating into his back. The past few days he had had nausea, vomiting and some epigastric pain, unable to eat. He came in and was noted to have a positive troponin of 0.12 and he did have cocaine and marijuana positive on his urine drug screen. He was admitted to the hospitalist service in telemetry and troponins were cycled and they were all at 0.12 range. Cardiology was consulted and the patient underwent a cardiac cath per Dr. Welton Flakes. Per his report, the cardiac cath showed normal coronaries with normal LV  ejection fraction, and the pain is likely noncardiac and probably the patient did have acute spasming of his coronaries, which bumped the troponins. The patient likely did not have acute coronary syndrome. The patient did have issues with his chronic pain. He did have mild renal failure, which is improving as well. He had mild hypokalemia and that was repleted. His LDL was 30 and his HDL was 97. At this point, he has no chest pain, but in regards to his chronic abdominal pain, this is in the setting of his chronic pancreatitis likely. He stated that he is on pancrelipase enzymes at home and he was encouraged to follow with his GI doctor and resumed the medications. At this point, he will be discharged with outpatient followup and Cardiology will see him on Tuesday at 11:00. He was counseled extensively about his tobacco and drug use.   PHYSICAL EXAMINATION:  On the day of discharge: VITAL SIGNS:  Temperature was 98.1, pulse rate 86, respiratory rate 18, blood pressure 162/96. The last blood pressure before that it was 144/83, O2 saturation 94% on room air.  GENERAL:  The patient is a well-developed, African American male lying in bed, no obvious distress.  HEENT:  Normocephalic, atraumatic. HEART:  Auscultation of the heart sounds shows a regular S1, S2 without any significant murmurs.  LUNGS:  Clear to auscultation.  ABDOMEN:  There is a midline healed surgical scar, but no significant epigastric tenderness. Positive bowel sounds.  EXTREMITIES:  He has no significant lower extremity edema.   At this point, he will be discharged with outpatient followup.   TOTAL TIME SPENT:  35 minutes.   CODE STATUS:  The patient has FULL CODE.   ____________________________ Krystal EatonShayiq Detron Carras, MD sa:jm D: 04/07/2013 10:59:00 ET T: 04/07/2013 11:15:07 ET JOB#: [id number]  cc: Krystal EatonShayiq Caylee Vlachos, MD, <Dictator> Krystal EatonSHAYIQ Aqil Goetting MD ELECTRONICALLY SIGNED 05/03/2013 1:50

## 2014-10-04 NOTE — Consult Note (Signed)
PATIENT NAME:  Walter Walter [name], Walter Walter [name] MR#:  [id number] DATE OF BIRTH:  [birth year]  DATE OF CONSULTATION:  04/06/2013  REFERRING PHYSICIAN:   CONSULTING PHYSICIAN:  Walter Walter [name]. Fermon Ureta, MD  INDICATION FOR CONSULTATION: Elevated troponin.   HISTORY OF PRESENT ILLNESS: This is Walter [name] 49 year old, African American male with Walter [name] past medical history of chronic pancreatitis, who came into the hospital with chest pain and abdominal pain. He was found to have mildly elevated troponin, thus I was asked to evaluate the patient. The patient states that he has been having intermittent dull to sharp pain in the left precordium associated with shortness of breath and diaphoresis lasting for like 10 to 15 minutes. It really got worse last night; thus he decided to come to the Emergency Room. He denies any chest pain right now. Does have some abdominal pain.   PAST MEDICAL HISTORY: History of chronic pancreatitis, hypertension and hyperlipidemia. No history of diabetes.   SOCIAL HISTORY: Still smokes about 1 pack per day. He drinks occasionally.   FAMILY HISTORY: Positive for coronary artery disease. His father and uncle both had MI below age 51 and his father died after having second MI.Marland Kitchen.   ALLERGIES: None.   MEDICATIONS: Chlorthalidone 25 mg once Walter [name] day, methadone 10 mg 4 tablets q.6 hours and Percocet.   PHYSICAL EXAMINATION:  GENERAL: He is alert, oriented x 3, in no acute distress.  VITAL SIGNS: His blood pressure is 162/97, pulse 79, respirations 18, temperature 97.8, and saturation 98%.  NECK: No JVD.  LUNGS: Clear.  HEART: Regular rate and rhythm. Normal S1 and S2. No audible murmur.  ABDOMEN: Soft. Tenderness diffusely over epigastrium.  EXTREMITIES: No pedal edema.  NEUROLOGIC: The patient appears to be intact.   EKG shows normal sinus rhythm. No acute changes.   LABS: His troponin, initial one, was 0.12. BUN 7 and creatinine 1.49.   ASSESSMENT AND PLAN: Mildly elevated troponin. No acute changes. He  continues to have chest pain. Does have risk factors for coronary artery disease. Advise proceeding with cardiac catheterization, which we will set it up today and start the patient on IV bicarbonate to give renal protection.   Thank you very much for the referral.  ____________________________ Walter Walter [name]. Walter Molinelli, MD sak:aw D: 04/06/2013 08:57:52 ET T: 04/06/2013 09:04:39 ET JOB#: [id number]  cc: Walter Walter [name]. Shaneta Cervenka, MD, <Dictator> Walter Walter [name] Virna Livengood MD ELECTRONICALLY SIGNED 04/09/2013 15:19

## 2014-10-04 NOTE — H&P (Signed)
PATIENT NAME:  Walter [name], Walter [name] MR#:  [id number] DATE OF BIRTH:  August 18, 1965  DATE OF ADMISSION:  04/05/2013  PRIMARY CARE PHYSICIAN: None.   CHIEF COMPLAINT: Epigastric pain.   HISTORY OF PRESENT ILLNESS: This is a 49 year old male who presents with epigastric pain radiating to his back. He has a history of chronic pancreatitis and former use of methadone. Has not had any methadone since July. He says that over the past few days he has had nausea and vomiting and this epigastric pain, unable to eat. He also is complaining of chest pain. He has had on and off chest pain since July. It is worsening in severity as well as more frequent episodes since July. He now has 2 to 3 episodes a week, lasting about 5 minutes, located substernally and radiating around to his shoulder. It is worse with smoking and no relieving factors. He does say that he walks and does not get any worse during walking. In the ER, his troponin was noted to be slightly elevated at 0.12. The patient does state that he used cocaine on Sunday as well as drinking some alcohol on Sunday.  REVIEW OF SYSTEMS: CONSTITUTIONAL: No fever, fatigue, weakness, pain, weight loss or gain. EYES: No blurred vision, glaucoma or cataracts.  ENT: No ear pain, difficulty hearing, seasonal allergies, postnasal drip. RESPIRATORY: Denies any cough, wheezing, hemoptysis, dyspnea.  CARDIOVASCULAR: Chest pain as mentioned above. No palpitations, orthopnea, syncope, edema, arrhythmia, dyspnea on exertion.  GASTROINTESTINAL: Positive nausea and vomiting. No diarrhea. Positive epigastric abdominal pain. Positive dark-colored stools. No hematemesis.  GENITOURINARY: No dysuria or hematuria, frequency or urgency.  ENDOCRINE: No polyuria, polydipsia, thyroid problems, heat or cold intolerance.  HEME AND LYMPH: No anemia or easy bruising.  SKIN: No rash or lesions.  MUSCULOSKELETAL: No limited activity, arthritis or cramps. NEUROLOGIC: No history of CVA, TIA,  seizures or dysarthria.  PSYCHIATRIC: No history of anxiety or depression.   PAST MEDICAL HISTORY: 1.  Chronic pancreatitis.  2.  Hypertension.   MEDICATIONS: Chlorthalidone 25 mg daily. He was taking methadone 10 mg 4 tablets every 6 hours. Last dose was in July. He is also taking Percocet 5/325 mg 2 times a day. He says that he is not taking these because his previous prescriber no longer prescribes narcotics.   ALLERGIES: No known drug allergies.   PAST SURGICAL HISTORY: He had a cholecystectomy and a hernia repair. He has a large abdominal scar.   FAMILY HISTORY: Positive for hypertension and CAD.   SOCIAL HISTORY: The patient smokes a pack about every 2 days. He drinks occasional alcohol, but he was a former alcoholic and that is how he got pancreatitis. The patient does use cocaine. Last use was on Sunday with some marijuana.   PHYSICAL EXAMINATION: VITAL SIGNS: Temperature is not written, pulse is 82, respirations 18, blood pressure 172/90 and 99% on room air.  GENERAL: The patient is alert and oriented, not in acute distress.  HEENT: Head is atraumatic. Pupils are round and reactive. Sclerae are anicteric. Mucous membranes are moist. Oropharynx is clear.  NECK: Supple without JVD, carotid bruit or enlarged thyroid.  HEART: Regular rate and rhythm. No murmurs, gallops or rubs.  LUNGS: Clear to auscultation without any crackles, rales, rhonchi or wheezing. Normal to percussion. Normal chest expansion.  ABDOMEN: Bowel sounds are positive. He has mild mid epigastric pain. No rebound or guarding, rigidity or ecchymosis. He has a large abdominal scar and some cholecystectomy scars as well.  MUSCULOSKELETAL: No pathology to  digits or nails. Extremities move x 4. No tenderness or effusion.  SKIN: No rash or lesions. Well-hydrated. No diaphoresis.  NEUROLOGIC: Cranial nerves II through XII grossly intact. There are no abnormalities. Motor strength is 4 out of 4 bilateral upper and lower  extremities.  PSYCHIATRIC: Judgment and insight are adequate. The patient is alert and oriented x 3.   LABORATORY AND DIAGNOSTICS: Sodium 139, potassium 3.2, chloride 109, bicarb 24, BUN 7, creatinine 1.49, glucose 113, calcium 8.7, bilirubin 0.3, alk phos 87, ALT 25, AST 23, total protein 6.7, albumin 3.1. Lipase is 160. White blood cells 9.2, hemoglobin 13.3, hematocrit 41 and platelets 202. Troponin 0.12. Urinalysis shows no LCE or nitrites. Urine toxicology is positive for cocaine and marijuana.   EKG: Normal sinus rhythm. No ST elevation or depression.   ASSESSMENT AND PLAN: A 49 year old male who presents with epigastric pain radiating to back with nausea and vomiting for 2 days, noted to have elevated troponin with increasing chest pain.  1.  Non-ST-elevation myocardial infarction, likely secondary to cocaine use. The patient's troponin is 0.12. He was started on a heparin drip, which we will continue. I will also start aspirin. No beta blocker due to cocaine abuse. We will obtain a cardiology consult, consider echo if the next set of troponins continue to go up which would mean his symptoms are consistent with a non-ST-elevation myocardial infarction.  2.  Epigastric pain. Sounds like pancreatitis. Lipase level is normal, but he does describe midepigastric pain radiating to the back and he has no rebound or guarding on exam. Will go head and treat this as pancreatitis with IV fluids, pain medications and n.p.o. We will also check a KUB. We could consider CT scan; however, his creatinine is slightly elevated today so we will hydrate the patient and if he still continues to have abdominal pain may consider CT scan if needed.  3.  Acute renal insufficiency, likely secondary to dehydration as the patient says he has not been eating for 2 days and had nausea and vomiting. Will provide hydration and repeat a BMP in the a.m.  4.  Hypokalemia, likely secondary to his nausea and vomiting. Will replete and  recheck in the a.m.  5.  History of tobacco abuse. The patient was counseled for 3 minutes. He is really not interested in quitting at this time, but he is interested in a nicotine patch.  6.  Alcohol and illicit drug use. I have also counseled him on this. He says that he does not want to use illicit drugs.  7.  History of methadone use. At this time, we will not prescribe him methadone. 8.  Accelerated hypertension. The patient does have underlying high blood pressure. We cannot use a beta blocker due to his cocaine use. I was going to use lisinopril. However, due to his renal insufficiency, we will not use this. I will go ahead and start hydralazine 10 mg p.o. t.i.d. and follow the blood pressure.   The patient is a FULL CODE status. The patient will need a PCP prior to discharge.   TIME SPENT: Approximately 50 minutes.  ____________________________ Donell Beers. Benjie Karvonen, MD spm:sb D: 04/05/2013 14:42:19 ET T: 04/05/2013 15:31:19 ET JOB#: [id number]  cc: Damyen Knoll P. Benjie Karvonen, MD, <Dictator> Donell Beers [place] Spainhower MD ELECTRONICALLY SIGNED 04/05/2013 18:18

## 2014-10-05 NOTE — Consult Note (Signed)
Brief Consult Note: Diagnosis: Alcohol, cocaine, cannabis dependence, Substance induced mood disorder.   Patient was seen by consultant.   Consult note dictated.   Recommend further assessment or treatment.   Orders entered.   Comments: Mr. Walter [name] has a h/o substance abuse here for detox.  PLAN: 1. Please continue CIWA.  2. Referral to [facility] completed.  3. Mood-we will start antidepressant.  4. HTN-will continue antiphypertensives.  5. psychiatry will follow along.  Electronic Signatures: Kristine LineaPucilowska, Tiffiny Worthy (MD)  (Signed 12-Jun-15 19:39)  Authored: Brief Consult Note   Last Updated: 12-Jun-15 19:39 by Kristine LineaPucilowska, Marcea Rojek (MD)
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
## 2014-10-05 NOTE — H&P (Signed)
PATIENT NAME:  Walter Walter [name], Walter Walter [name] MR#:  [id number] DATE OF BIRTH:  07-02-1965  DATE OF ADMISSION:  05/03/2014  IDENTIFYING INFORMATION: The patient is Walter [name] 49 year old widowed male who presents stating he is hearing voices that tell him to kill himself.   CHIEF COMPLAINT: "Hearing voices telling me to hurt myself and hit my head against the wall and my hand against my head."  HISTORY OF PRESENT ILLNESS: The patient states that on May 02, 2014, is when he started to hear these voices. He states I have "PTSD." He states that the voices were telling him to hurt himself. He states he has not slept for 3 to 4 days. He endorses visual hallucinations of Walter [name] "little man" telling him what to do. He endorses difficulties with sleeping, decreased appetite, and depressed mood. He also relates he has chronic pancreatitis. He was not taking any psychiatric medication nor under any psychiatric care. He states his most recent psychiatric/substance abuse care was at [facility] in June of this year. He states there is where he was diagnosed with PTSD.   PAST PSYCHIATRIC HISTORY: He does have Walter [name] past admission to this facility in July 2015. It appears that admission was for 5 days and he presented requesting detox; had symptoms of depression that were described as vague hallucinations and suicidal ideation. The disposition was to involuntary commitment to an alcohol drug treatment center ([facility]). He was discharged on hydrochlorothiazide, pancrelipase, pantoprazole, and Percocet. He reports past suicide attempts, one being in June 2015 when he jumped in front of Walter [name] car. He states that he was not injured because the car stopped. He relates that he has used alcohol daily and states that he would use 4 pints Walter [name] day. He states he has been drinking like this since his wife passed away in 2009. He states his longest period of sobriety was when he was in the [facility] for 28 days. Cocaine use: He states he would use 20 to 30 dollars twice Walter [name]  week. He states his cigarette use is using about 1 pack of cigarettes over Walter [name] 2-day period and he has done this for 15 years.   SOCIAL HISTORY: The patient is widowed. He states he has 1 adult child, age 51. He is on unemployed.   REVIEW OF SYSTEMS: The patient reports pain in his left eye. He also reports stomach pain from his chronic pancreatitis. He states he has diarrhea. He endorses being dizzy, having blurring vision. He denies any chest pain presently. He endorses shortness of breath.   MENTAL STATUS EXAMINATION: The patient is alert and oriented x 4. He is dressed in hospital attire. He has fair grooming. He makes good eye contact. Of note, his left eye is red. He is calm and cooperative. His thought process is linear. His speech is normal volume, but slow rate. Thought content: He endorses suicidal ideation. He denies homicidal ideation. He endorses auditory hallucinations but does not appear to be responding to internal stimuli at this time. He endorses occasional visual hallucinations as discussed above. His mood is down. His affect is depressed. His insight and judgment appear to be poor. His remote memory is intact as evident by his ability to provide history.   PHYSICAL EXAMINATION:  GENERAL: The patient is Walter [name] middle-aged male appearing his stated age of 49. He was alert and oriented x 4.  VITAL SIGNS: His temperature is 98.5. His pulse his is 78, respiratory rate of 18, blood pressure 152/76.  NEUROLOGIC: His muscle tone and  gait were within normal limits. There were no abnormal movements.   LABORATORY RESULTS: Significant for elevated AST of 47, total protein slightly elevated at 8.3, GFR slightly decreased at 58. Ethanol level less than 3. His CBC was significant for an elevated white count at 13.7, and MCHC mildly decreased at 31.3, and RDW mildly increased at 15.5. His urinalysis was within normal limits. He did have an x-ray of his right hand. The impression was no acute bony  abnormality.   DIAGNOSES: 1.  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  2.  Alcohol use disorder, severe.  3.  Cocaine use disorder, moderate.      4.  Chronic pancreatitis.  5.  Erythematous left eye.  ASSESSMENT AND PLAN: The patient is on alcohol withdrawal protocol with lorazepam. He is interested in antidepressants and also sleeping well, thus risks and benefits of Remeron have been discussed and the patient consents to Walter [name] trial of Remeron. I will contact hospitalist given his issues with his left eye and some swelling in his left arm. We will repeat CBC and differential.           ____________________________ Loralie Champagne. Mayford Knife, MD alw:ts D: 05/04/2014 16:47:32 ET T: 05/04/2014 16:56:12 ET JOB#: [id number]  cc: Leory Plowman L. Mayford Knife, MD, <Dictator> Kerin Salen MD ELECTRONICALLY SIGNED 06/13/2014 11:56

## 2014-10-05 NOTE — Consult Note (Signed)
PATIENT NAME:  Walter [name], Walter [name] MR#:  [id number] DATE OF BIRTH:  [birth year]  DATE OF ADMISSION:  11/22/2013 DATE OF CONSULTATION:  11/23/2013  REFERRING PHYSICIAN:  Glennie IsleSheryl Gottlieb, MD CONSULTING PHYSICIAN:  Tahmir Kleckner B. Arlina Sabina, MD  REASON FOR CONSULTATION:  To evaluate [name] patient with alcoholism.   IDENTIFYING DATA:  Walter [name] is [name] 49 year old male with history of depression and substance use.   CHIEF COMPLAINT:  "I need detox."   HISTORY OF PRESENT ILLNESS:  Walter [name] was in [name] relationship up until 2 months ago when his girlfriend kicked him out. He denies that this was resulting from his drinking. When homeless, he relapsed on alcohol and cocaine. He has been using both heavily.  In addition, he reports that he has been prescribed narcotic pain killers for consequences of abdominal surgery.  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  He is negative for narcotics on admission. He reports that he is tired of drinking and being homeless. Apparently, he spent his paycheck on drugs already and came to the hospital asking for detox. The patient reports some symptoms of depression with poor sleep, decreased appetite, anhedonia, feeling of guilt, hopelessness, worthlessness, crying spells, but he denies feeling suicidal or homicidal. His depression has gotten worse since 2009 when his wife committed suicide. He is unwilling to talk about details of it. He, at the moment, is completely alone, as he is also estranged from his 3 adult children.   PAST PSYCHIATRIC HISTORY:  He denies prior hospitalizations, suicide attempt or substance abuse treatment.   FAMILY PSYCHIATRIC HISTORY:  None reported.   PAST MEDICAL HISTORY:  Hepatitis C, hypertension, history of pancreatitis with pancreatic cyst removal.   MEDICATIONS ON ADMISSION:  Chlorthalidone 25 mg daily, aspirin 81 mg daily.   ALLERGIES:  No known drug  allergies.   SOCIAL HISTORY: As above. He is disabled from medical problems. He is homeless. He has absolutely no social support. He has Medicare.   REVIEW OF SYSTEMS:  CONSTITUTIONAL:  No fevers or chills. No weight changes.  EYES:  No double or blurred vision.  ENT:  No hearing loss.  RESPIRATORY:  No shortness of breath or cough.  CARDIOVASCULAR:  No chest pain or orthopnea.  GASTROINTESTINAL:  No abdominal pain, nausea, vomiting or diarrhea.  GENITOURINARY:  No incontinence or frequency.  ENDOCRINE:  No heat or cold intolerance.  LYMPHATIC:  No anemia or easy bruising.  INTEGUMENTARY:  No acne or rash.  MUSCULOSKELETAL:  No muscle or joint pain.  NEUROLOGIC:  No tingling or weakness.   PSYCHIATRIC:  See history of present illness for details.   PHYSICAL EXAMINATION: VITAL SIGNS:  Blood pressure 134/92, pulse 75, respirations 16, temperature 98.  GENERAL:  This is [name] well-developed male in no acute distress. The rest of the physical examination is deferred to his primary attending.   LABORATORY DATA:  Chemistries are within normal limits except for creatinine of 1.52, blood alcohol level is 0. LFTs within normal limits except for AST of 40. Urine tox screen positive for cocaine and cannabinoids. CBC within normal limits. Urinalysis is not suggestive of urinary tract infection. Serum acetaminophen less than 2. Serum salicylates 3.   MENTAL STATUS EXAMINATION:  The patient is alert and oriented to person, place, time and situation. He is pleasant, polite and cooperative. He is well groomed, wearing hospital scrubs. He maintains good eye contact. His speech is  soft. Mood is depressed with full affect. Thought process is logical and goal oriented. Thought content: He denies thoughts of hurting himself or others. There are no delusions or paranoia. There are no auditory or visual hallucinations. His cognition is grossly intact. His registration, recall, short and long term memory are good. He  is of average intelligence and fund of knowledge. His insight and judgment are poor.   DIAGNOSES: AXIS I:  Alcohol dependence, cocaine dependence, cannabis dependence, substance-induced mood disorder.  AXIS II:  Deferred.  AXIS III:  Hepatitis C, hypertension, history of pancreatitis with pancreatic cyst removal.  AXIS IV:  Substance abuse, housing, primary support.  AXIS V:  Global assessment of functioning 45.   PLAN:  1.  The patient was started on CIWA protocol by Emergency Room physician. We will continue that. There are no symptoms of alcohol withdrawal. Vital signs are stable.  2.  The patient was referred to [facility] rehab facility for further treatment.  3.  The patient is interested in treatment of depression. We will start Prozac.  4.  Medical: We will continue treatment of hypertension.  5.  Psychiatry will follow up.   ____________________________ Ellin Goodie. Jennet Maduro, MD jbp:dmm D: 11/23/2013 19:36:18 ET T: 11/23/2013 21:01:25 ET JOB#: [id number]  cc: Ermalee Mealy B. Jennet Maduro, MD, <Dictator> Shari Prows MD ELECTRONICALLY SIGNED 12/24/2013 7:44

## 2014-10-05 NOTE — Consult Note (Signed)
PATIENT NAME:  Walter [name], Walter [name] MR#:  [id number] DATE OF BIRTH:  10/11/1965  DATE OF CONSULTATION:  05/04/2014  REFERRING PHYSICIAN:  Behavioral Medicine MD   CONSULTING PHYSICIAN:  Coila Wardell H. Allena Katz, MD  REASON FOR CONSULTATION: Left eye erythema and right forearm swelling.   HISTORY OF PRESENT ILLNESS: The patient is a 49 year old African American male with history of hypertension, history of chronic pancreatitis, who was initially seen in the ED after he was hearing voices telling him to kill himself, so he started taking his hands and started punching the wall, and then he also started hitting his head on the wall. The patient was brought into the hospital and admitted to Behavioral Medicine. Today, his right hand and forearm are swollen; therefore, we were asked to see the patient. He did have an x-ray which showed chronic changes. The patient also complains of left eye that is red and he is having blurred vision. He otherwise denies any fevers, chills. No chest pain. No shortness of breath.   PAST MEDICAL HISTORY: History of hypertension, history of chronic pancreatitis.   ALLERGIES: None.   HOME MEDICATIONS: Risperdal 1 mg at bedtime, Prozac 20 daily, pancrelipase 1 cap  t.i.d., hydrochlorothiazide 50 mg 1 tab p.o. daily, acetaminophen-oxycodone 325/5 mg q. 6 hours p.r.n. for pain.   SOCIAL HISTORY: Smokes less than a pack per day. Drinks heavy from time to time. He also abuses drugs.   FAMILY HISTORY: Positive for hypertension.   REVIEW OF SYSTEMS:  CONSTITUTIONAL: Denies any fevers or fatigue. No weight loss. No weight gain.  EYES: Complains of the left eye being blurred with redness and drainage. No history of glaucoma or cataracts.  ENT: No tinnitus. No ear pain. No hearing loss. No seasonal or year-round allergies. No epistaxis. No difficulty swallowing.  RESPIRATORY: Denies any cough, wheezing, hemoptysis. No COPD, no TB.  CARDIOVASCULAR: Denies any chest pain, orthopnea, edema,  or arrhythmia.  GASTROINTESTINAL: No nausea, vomiting, diarrhea. No abdominal pain. He has a history of chronic pancreatitis.  GENITOURINARY: Denies any dysuria, hematuria, renal calculus, or frequency.  ENDOCRINE: Denies any polyuria, nocturia, thyroid problems.  HEMATOLOGIC AND LYMPHATIC: Denies any lymph node enlargement.  SKIN: No acne. No rash.  MUSCULOSKELETAL: Denies any pain in the neck, back, or shoulder. Complains of right forearm pain and hand swelling.  NEUROLOGIC: No numbness, CVA, or TIA.  PSYCHIATRIC: He has visual hallucinations.   PHYSICAL EXAMINATION:  VITAL SIGNS: Temperature 98.5, pulse 78, respirations 18, blood pressure 152/76.  GENERAL: The patient is well-developed, well-nourished male in no acute distress.  HEENT: Head atraumatic, normocephalic. Pupils equally round, reactive to light and accommodation. On the left eye there is conjunctival erythema and he has got drainage. Nasal exam shows no drainage or erythema. Oropharynx is clear without any exudate.  NECK: Supple without any JVD. CARDIOVASCULAR: Regular rate and rhythm. No murmurs, rubs, clicks, or gallops.  LUNGS: Clear to auscultation bilaterally without any rales, rhonchi, or wheezing.  ABDOMEN: Soft, nontender, nondistended. Positive bowel sounds x 4.  EXTREMITIES: No clubbing, cyanosis, or edema. Right upper extremity: His hand is swollen and forearm is swollen.   NEUROLOGIC: Awake, alert, oriented x 3. No focal deficits.  PSYCHIATRIC: Currently not anxious or depressed. LYMPH NODES: None palpable.  MUSCULOSKELETAL: Swelling in the right forearm as described above.   ASSESSMENT AND PLAN: The patient is a 49 year old currently on Behavioral Medicine with issues involving swelling of the right hand and forearm, and left eye erythema.  1.  Right hand  swelling and forearm swelling due to trauma: No evidence of infection. We will check a CT scan of the hand and forearm. Recommend orthopedic consult.  2.  Left  eye with erythema and blurred vision: Needs ophthalmologic evaluation. I have spoken to the Behavioral Medicine MD to arrange this as soon as possible. I would not recommend putting any eye drops until seen by an ophthalmologist.  3.  Hypertension with elevated creatinine: Would will use only hydrochlorothiazide 25 mg. We will start him on atenolol for better blood pressure control.  4.  Chronic pancreatitis: Continue pancrelipase.  5.  Visual hallucinations: Treatment per psychiatry.   TIME SPENT: 45 minutes on this H and P.     ____________________________ Serita GritShreyang H. Allena KatzPatel, MD shp:ts D: 05/04/2014 17:29:21 ET T: 05/04/2014 18:05:12 ET JOB#: [id number]  cc: Shanea Karney H. Allena KatzPatel, MD, <Dictator> Charise CarwinSHREYANG H Holden Draughon MD ELECTRONICALLY SIGNED 05/12/2014 13:39

## 2014-10-05 NOTE — Consult Note (Signed)
PATIENT NAME:  Walter Walter [name], Walter Walter [name] MR#:  [id number] DATE OF BIRTH:  [birth year]  DATE OF CONSULTATION:  05/03/2014  CONSULTING PHYSICIAN:  Audery AmelJohn T. Clapacs, MD  IDENTIFYING INFORMATION AND REASON FOR CONSULT: Walter [name] 49 year old man, states that he is hearing voices telling him to kill himself.   HISTORY OF PRESENT ILLNESS: The patient says that he has been feeling depressed and having thoughts about suicide for the last couple of days. He says that he was thinking of drinking antifreeze, but his brother-in-law stopped him. He was walking out in traffic today, trying to get hit. Mood has been very depressed for Walter [name] couple of weeks. He says he has been in some disagreements with his family and does not think that those people are going to let him stay there anymore. He is currently drinking Walter [name] couple of 40 ounce bottles of beer Walter [name] day and has been using cocaine and marijuana and other drugs. He is not going to any outpatient psychiatric treatment. He is not taking any of his prescription medicine. He is not sleeping well. Having stomach pain, consistent with his chronic pancreatitis. Wants detox and stabilization of his mood.   PAST PSYCHIATRIC HISTORY: The patient has presented to the ER several times in the past similarly. He had an admission to the hospital earlier this year for depression and substance abuse and was sent to the alcohol and drug abuse treatment center. Has never been able to stay sober for Walter [name] significant period of time after getting out of rehab. Did not stay compliant with antidepressant medicine. He has Walter [name] past history of being diagnosed with PTSD. Has talked Walter [name] lot about killing himself, but other than this supposed walking out in traffic, it does not sound like he made Walter [name] serious suicide attempt.   PAST MEDICAL HISTORY: The patient has chronic pancreatitis. Has disability for it. High blood pressure.   SOCIAL HISTORY: The patient is Walter [name] widower, wife having died several years ago. Adult son is somewhat  estranged. His relationship with his family is stressed, probably mostly by his substance abuse and behavior. It does not sound like he has very much activity in his life.   FAMILY HISTORY: Says that his sister, who is deceased, had mental health problems.   CURRENT MEDICATIONS: Taking nothing.   ALLERGIES: No known drug allergies.   SUBSTANCE ABUSE HISTORY: History of recurrent alcohol abuse, as well as cocaine abuse and abuse of multiple other medicines. I do not believe he has had Walter [name] seizure, but he does get pretty shaky coming off of alcohol. Has been sent to inpatient rehab before, but did not stay sober or follow up with treatment afterwards.   REVIEW OF SYSTEMS: Depressed mood. Auditory hallucinations. Suicidal ideation. Abdominal pain and nausea. Poor sleep, fatigue, feeling negative about himself.   MENTAL STATUS EXAMINATION: Disheveled gentleman who looks older than his stated age. He is cooperative with the interview. Eye contact good. Psychomotor activity Walter [name] little bit slow. Speech is decreased in total amount but clear  enough. Affect is tearful and dysphoric. Mood stated as depressed. Thoughts are generally lucid. No obvious loosening of associations or delusions. Denies homicidal ideation, but voices suicidal ideation and says that he is having auditory hallucinations telling him to kill himself. Can remember 3/3 objects immediately and 2/3 at 3 minutes. He is alert and oriented x 4. Fund of knowledge and basic intelligence normal. Judgment and insight adequate.   DIAGNOSTIC DATA: An x-ray was done of his right-hand because he said that  he hit it recently, but it shows chronic changes, nothing acute.   DIAGNOSTIC DATA: Salicylates and acetaminophen and alcohol all negative. Elevated AST at 47. Elevated chloride 109. Elevated creatinine 1.62. White count elevated at 13.7. Urinalysis unremarkable. Drug screen positive for cocaine and marijuana.   VITAL SIGNS: Blood pressure 144/106,  respirations 20, pulse 107, temperature 98.4.   ASSESSMENT: This is Walter [name] 49 year old gentleman with Walter [name] history of polysubstance abuse and depressive symptoms. Unclear if there are just related to his substance abuse or are an independent issue. He is reporting depressed mood, claims to have auditory hallucinations and is reporting suicidal ideation. He obviously has relapsed into drug use, sounds like he is probably going through Walter [name] lot of strain, probably does not have Walter [name] place to live. His current presentation justifies admission to the hospital for safety.   TREATMENT PLAN: Admit to psychiatry. Detox orders in place. Continue pancrelipase with every meal as well as restarting his hydrochlorothiazide and pantoprazole. The patient will be engaged in groups on the unit. Suicide precautions as well as seizure and fall in place.   DIAGNOSIS: Principal and primary:  AXIS I: Major depression, recurrent, severe.   SECONDARY DIAGNOSES: AXIS I:  1. Alcohol abuse.  2. Cocaine abuse.  3. Marijuana abuse.   AXIS II:  1. Chronic pancreatitis.  2. High blood pressure.    ____________________________ Audery Amel, MD jtc:TT D: 05/03/2014 17:28:36 ET T: 05/03/2014 17:54:54 ET JOB#: [id number]  cc: Audery Amel, MD, <Dictator> Audery Amel MD ELECTRONICALLY SIGNED 05/10/2014 14:46

## 2014-10-05 NOTE — Consult Note (Signed)
Brief Consult Note: Diagnosis: abd pain, chronic pancreatitis, tobacco use diosrder, High Blood Pressure (Hypertension), alc abuse.   Patient was seen by consultant.   Consult note dictated.   Orders entered.   Comments: 47y/oM with PMH of chronic pancreatitis, alc abuse, High Blood Pressure (Hypertension), smoking admitted to behavioural medicine unit for alc detox and depression. Medical consult for abd pain with history of chronic pancreatitis  * Abd pain- more subjective pain on exam, lipase normal however patient had partial pancreatectomy doen and with chronic pancreatitis, sometimes lipase is not reliable so will get CT abd, no nausea and vomiting could be somatic complaints monitor for now history of narcotic dependance, avoid narcotics  * Chronic pancreatitis- minimal diarrhea, on pancrease, continue  tolerating diet well  * CKD- stable  * HTN- hctz  * Alc abuse- detox, per psych management  * Tobacco use disorder- already on nicotral inh, requested nicotine patch as well  * Depression- on prozac.  Electronic Signatures: Enid BaasKalisetti, Nacole Fluhr (MD)  (Signed 10-Jul-15 15:59)  Authored: Brief Consult Note   Last Updated: 10-Jul-15 15:59 by Enid BaasKalisetti, Tristan Bramble (MD)

## 2014-10-05 NOTE — Consult Note (Signed)
PATIENT NAME:  Walter [name], Walter [name] MR#:  [id number] DATE OF BIRTH:  17-Aug-1965  DATE OF CONSULTATION:  12/21/2013  ADMITTING PHYSICIAN: Audery Amel, MD.  CONSULTING PHYSICIAN:  Enid Baas, MD.   PRIMARY CARE PHYSICIAN: None.  REASON FOR CONSULTATION:  Abdominal pain and chronic pancreatitis.   BRIEF HISTORY: Mr. Karim is a 49 year old male with past medical history significant for significant alcohol abuse, smoking, history of chronic pancreatitis, status post partial pancreatectomy done in the past, got admitted to Behavioral Medicine Unit for alcohol detoxification and also depression symptoms. The patient states he has been on methadone and Percocet at home for his chronic abdominal pain and is not getting his pain medications here. Not sure who is prescribing his pain medications as an outpatient. However, he has been complaining of some abdominal pain and the medical consult has been requested for the same. The  patient denies any vomiting. He complains of some nausea, but he has been eating his diet. Also has 1 to 2 loose bowel movements every day, likely from his chronic pancreatitis and is on pancreatic enzyme supplements. No fevers or chills. No other complaints at this time.   PAST MEDICAL HISTORY:  1.  Hypertension.  2.  Chronic pancreatitis.  3.  Alcohol abuse. 4.  Tobacco use disorder.   PAST SURGICAL HISTORY: 1.  Partial pancreatectomy.  2.  Cholecystectomy.   ALLERGIES TO MEDICATIONS:  No known drug allergies.   HOME MEDICATIONS:   1.  Hydrochlorothiazide 2.  Pancreatic enzyme supplements.  3.  Percocet unknown dose.  4.  Methadone unknown dose.  SOCIAL HISTORY:  The patient resting at home, continues to smoke about 1 to 2 packs every day and drinking quite a bit of alcohol about a pint of wine and a pint of liquor and some beer most of the days.  Also a history of using crack cocaine and marijuana.   FAMILY HISTORY: Significant for heart disease in both  parents.   REVIEW OF SYSTEMS:  CONSTITUTIONAL: No fever, fatigue or weakness.  EYES: No blurred vision, double vision, inflammation or glaucoma.  ENT: No tinnitus, ear pain, hearing loss, epistaxis or discharge.  RESPIRATORY: No cough, wheeze, hemoptysis or COPD.  CARDIOVASCULAR: No chest pain, orthopnea, edema, arrhythmia, palpitations, or syncope.  GASTROINTESTINAL: Positive for abdominal pain and nausea and diarrhea.  No vomiting, hematemesis, melena or GERD. GENITOURINARY:  No dysuria, hematuria, renal calculous or urinary  incontinence.  ENDOCRINE: No polyuria, nocturia, thyroid problems, heat or cold intolerance. HEMATOLOGY:  No anemia, easy bruising or bleeding.  SKIN: No acne, rash or lesions.  MUSCULOSKELETAL: No neck, back, shoulder pain, arthritis or gout.  NEUROLOGIC: No numbness, weakness, CVA, TIA or seizures.  PSYCHOLOGICAL: Positive for depression.  No anxiety or insomnia.   PHYSICAL EXAMINATION: VITAL SIGNS:  Temperature 98.4 degrees Fahrenheit, pulse 89, respirations 20, blood pressure 146/105, pulse oximetry 99% on room air.  GENERAL: A well-built, well-nourished male lying in bed,  not in any acute distress.  HEENT: Normocephalic, atraumatic. Pupils equal, round, reacting to light. Anicteric sclerae. Extraocular movements intact. Oropharynx is clear without erythema, mass or exudates.  NECK: Supple. No thyromegaly, JVD or carotid bruits. No lymphadenopathy.  LUNGS: Moving air bilaterally. No wheeze or crackles. No use of accessory muscles for breathing.  CARDIOVASCULAR: S1, S2, regular rate and rhythm. No murmurs, rubs, or gallops.  ABDOMEN: Soft. Mild discomfort in the right upper quadrant and left lower quadrant regions. No guarding. No rigidity. Normal bowel sounds.   EXTREMITIES: No  pedal edema. No clubbing or cyanosis, 2+ dorsalis pedis pulses palpable bilaterally.  SKIN: No acne, rash or lesions.  LYMPHATICS: No cervical or inguinal lymphadenopathy.  NEUROLOGIC:  Cranial nerves intact. No focal, motor or sensory deficits.  PSYCHOLOGICAL: The patient is awake, alert, oriented x3.   LABORATORY DATA: WBC 7.4, hemoglobin 13.4, hematocrit 41.1, platelet count 196,000.   Sodium 142, potassium 3.5, chloride 110, bicarbonate 25, BUN 13, creatinine 1.5, glucose 109 and calcium of 8.9. ALT 37, AST 36, alkaline phosphatase 83, total bilirubin 0.3, albumin of 3.4. Alcohol level is negative.  Acetaminophen level is negative.  Salicylate level is within normal limits. Lipase 171.  Urin toxicology screen positive for marijuana, cocaine and urinalysis negative for any infection.  RADIOLOGY DATA:  X-ray of the right hand showing no fracture or dislocation.  RECOMMENDATIONS:  A 49 year old male with history of chronic pancreatitis, alcohol abuse, hypertension, smoking, admitted today to Behavioral Medicine for alcohol detoxification and depression. Medical consult has been requested for abdominal pain with history of his chronic pancreatitis.  1.  Abdominal pain more subjective pain.  Lipase is normal; however, the patient had partial pancreatectomy and with chronic pancreatitis, lipase is not reliable.  Will get CT of abdomen to rule out acute inflammatory changes.  He is able to tolerate diet well without nausea or vomiting. Could be just somatic complaints. Continue to monitor. No narcotics until there is  CT evidence because of his history of narcotic dependence.  2.  Chronic pancreatitis, minimal diarrhea on pancreatic enzymes.  Will continue tolerating diet well.  3.  Hypertension. Continue hydrochlorothiazide.  4.  Chronic kidney disease stable.   5.  Alcohol abuse on zero protocol. Management per psychiatry.  6. Tobacco use disorder already on a Nicotrol inhaler, requesting for nicotine patch.  Will order that. 7.  Depression on Prozac. 8.  Code Status:  Full code.   TIME SPENT IN CONSULTATION:  50 minutes.    ____________________________ Enid Baasadhika Garvis Downum,  MD rk:ds D: 12/21/2013 15:59:05 ET T: 12/21/2013 17:04:05 ET JOB#: [id number]  cc: Enid Baasadhika Areeb Corron, MD, <Dictator>  Enid BaasADHIKA Doye Montilla MD ELECTRONICALLY SIGNED 01/02/2014 13:32

## 2014-10-05 NOTE — Discharge Summary (Signed)
PATIENT NAME:  Walter [name], Walter [name] MR#:  [id number] DATE OF BIRTH:  [birth year]  DATE OF ADMISSION:  12/19/2013 DATE OF DISCHARGE:  12/24/2013  HOSPITAL COURSE: This 49 year old gentleman with [name] history of alcohol dependence was admitted to the hospital requesting detox, also with multiple symptoms of depression including vague hallucinations and suicidal ideation. He has been treated in the hospital for alcohol withdrawal. He has required some Ativan and has had some jitteriness, but there have been no seizures and no sign of DTs. He has cooperated in group therapy appropriately. He continues to be focused on wanting to get treatment for his alcohol abuse and was cooperative with [facility]. His mood continues to be mildly depressed and hopeless, but he is no longer reporting acute suicidal ideation. Hallucinations have resolved. He has been taking Prozac since being in the hospital and tolerating it well. As far as his medical condition, he has chronic pancreatitis. Internal medicine and has seen him. He is currently taking pancrelipase 3 times [name] day and also taking the occasional p.r.n. 5 mg Percocet for his chronic pain, which has been documented. Hydrochlorothiazide has been continued for his blood pressure, which is now stable. The patient is stable, but is agreeable to transfer because of ongoing need for substance abuse treatment.   DISCHARGE MEDICATIONS: Hydrochlorothiazide 50 mg per day, pancrelipase 12,000 units with each meal, pantoprazole 40 mg twice [name] day, Percocet 5 mg 1 every 6 hours as needed for pain.   LABORATORY RESULTS: Admission labs included [name] drug screen positive for cocaine, cannabis and barbiturates. Urinalysis normal. The CBC was normal. Chemistry panel: Elevated chloride 110. Liver functions all normal. Alcohol level negative. Lipase 171. He had an x-ray of his finger because of [name] complaint about acute pain that was normal. [name] CT was done of  his abdomen because of his history of pancreatitis, which did show mild peripancreatic edema and [name] history of [name] prior cholecystectomy.   MENTAL STATUS EXAMINATION AT DISCHARGE: Neatly groomed gentleman, looks his stated age, cooperative with the interview. Good eye contact. Normal psychomotor activity. Speech normal rate, tone and volume. Affect calm, mildly dysphoric, but reactive. Mood stated as being still [name] little down. Thoughts are lucid without loosening of associations. No evidence of delusions. Denies auditory or visual hallucinations. Passive suicidal thoughts at times, but no plan or intent. No homicidal ideation. Alert and oriented x 4. Short and long-term memory intact. Judgment and insight good.   DISPOSITION: Transfer now under [facility].   DIAGNOSIS, PRINCIPAL:  AXIS I:  Substance-induced mood disorder, resolving.   SECONDARY DIAGNOSES: AXIS I:  1.  Alcohol dependence.  2.  Cocaine dependence.  3.  Marijuana abuse.  4.  Major depression, recurrent, moderate.  AXIS II: Deferred.  AXIS III: High blood pressure, chronic pancreatitis.  AXIS IV: Severe from lack of social support.  AXIS V: Functioning at time of discharge and transfer 48.    ____________________________ Audery AmelJohn T. Betina Puckett, MD jtc:dmm D: 12/24/2013 12:27:58 ET T: 12/24/2013 13:05:20 ET JOB#: [id number]  cc: Audery AmelJohn T. Anayia Eugene, MD, <Dictator> Audery AmelJOHN T Rosalea Withrow MD ELECTRONICALLY SIGNED 12/30/2013 12:40

## 2014-10-06 NOTE — H&P (Signed)
PATIENT NAME:  Walter [name], Walter [name] MR#:  [id number] DATE OF BIRTH:  11-17-1965  DATE OF ADMISSION:  11/10/2011  PRIMARY CARE PHYSICIAN: Loma Sender, MD   REQUESTING PHYSICIAN: Dr. Buford Dresser   CHIEF COMPLAINT: Abdominal pain, nausea, vomiting.   HISTORY OF PRESENT ILLNESS: The patient is a 49 year old male with a known history of chronic pain and chronic hepatitis who is being admitted for acute on chronic pancreatitis. The patient started having severe abdominal pain for the last three days. He was doing cocaine and marijuana on Sunday. He was also drinking quite heavily. He started having severe abdominal pain. He tried to keep it under control with his home dose of methadone and Percocet but did not still get under control. He was having a lot of nausea, was throwing up at least 3 to 4 times a day, twice today, and decided to come to the Emergency Department for further evaluation and management. Right now he is still complaining of 8 to 9 out of 10 pain. His CT scan of the abdomen and pelvis showed acute pancreatitis and small 10 mm hypodense lesion in the pancreatic head suggestive of pseudocyst versus cystic neoplasm and he is being admitted for further evaluation and management.   PAST MEDICAL HISTORY:  1. Chronic pain.  2. Chronic pancreatitis.  3. Hypertension.   PAST SURGICAL HISTORY:  1. Cholecystectomy. 2. Hernia repair.   HOME MEDICATIONS: 1. Methadone 10 mg 4 tablets p.o. b.i.d.  2. Percocet 5/325 2 tablets every 4 to 6 hours as needed.   ALLERGIES TO MEDICATIONS: None.   SOCIAL HISTORY: Smokes 1 to 2 packs of cigarettes daily for the last at least 20 to 25 years. Drinks alcohol over the weekends. Also smokes marijuana and cocaine. Last one was over the weekend.   FAMILY HISTORY: Sister died of diabetes. Diabetes and hypertension also runs in the family.    REVIEW OF SYSTEMS: CONSTITUTIONAL: No fever, fatigue. Positive for pain. EYES: No blurred or double vision. ENT: No  tinnitus or ear pain. RESPIRATORY: No cough, wheezing, hemoptysis. CARDIOVASCULAR: No chest pain, orthopnea, edema. GI: Positive for nausea, vomiting, abdominal pain. GU: No hematuria. ENDOCRINE: No polyuria or nocturia. HEMATOLOGY: No anemia or easy bruising. SKIN: No rash or lesion. MUSCULOSKELETAL: No arthritis or muscle cramps. NEUROLOGIC: No tingling or muscle weakness. PSYCHIATRIC: No history of anxiety or depression.   PHYSICAL EXAMINATION:   VITAL SIGNS: Temperature 98.2, heart rate 84 per minute, respirations 18 per minute, blood pressure 124/81 mmHg. He is saturating 99% on room air.  GENERAL: The patient is a 49 year old male lying in the bed comfortably without any acute distress.   EYES: Pupils equal, round, reactive to light and accommodation. No scleral icterus. Extraocular muscles intact.   HEENT: Head atraumatic, normocephalic. Oropharynx and nasopharynx clear.   NECK: Supple. No jugular venous distention. No thyroid enlargement or tenderness.   LUNGS: Clear to auscultation bilaterally. No wheezing, rales, rhonchi, or crepitation.   CARDIOVASCULAR: S1, S2 normal. No murmurs, rubs, or gallops.   ABDOMEN: Soft, nondistended. Has tenderness all over his belly, generalized. No organomegaly appreciated. Bowel sounds present.   NEUROLOGIC: Nonfocal examination. Cranial nerves III through XII intact. Muscle strength 5  out of 5 in extremities. Sensation intact.   PSYCHIATRIC: The patient is oriented to time, place, and person x3.    SKIN: No obvious rash, lesion, or ulcer.   EXTREMITIES: No pedal edema, cyanosis, or clubbing.   LABORATORY, DIAGNOSTIC, AND RADIOLOGICAL DATA: Normal BMP except sodium 128, potassium 3.3,  chloride 92. Normal liver function tests. Lipase 160. Normal CBC except white count of 14.6. Urinalysis was negative.   CT scan of the abdomen and pelvis with contrast while in the ED showed acute pancreatitis. Small 10 mm pseudocyst versus cystic neoplasm versus  pancreatic cyst.   IMPRESSION AND PLAN:  1. Acute on chronic pancreatitis. Will keep him n.p.o. except meds. Start him on IV hydration. Provide pain medication in the form of morphine as needed. Will continue his home medication of methadone and Percocet. Will consult GI. Check amylase and lipase in the morning. Check urine drug screen.  2. Hyponatremia. Will provide IV hydration. This is likely due to dehydration from nausea and vomiting.  3. Hypokalemia. Will replete and recheck. 4. Tobacco abuse. He was counseled for about three minutes. He is agreeable to work on quitting and agrees with nicotine replacement therapy while in the hospital which we will order.   TOTAL TIME TAKING CARE OF THIS PATIENT: 45 minutes.   ____________________________ Ellamae SiaVipul S. Sherryll BurgerShah, MD vss:drc D: 11/10/2011 23:45:58 ET T: 11/11/2011 08:26:29 ET JOB#: [id number]  cc: Alfonsa Vaile S. Sherryll BurgerShah, MD, <Dictator> Marcine Matarharles W. Phillips Jr., MD Ellamae SiaVIPUL S [facility] MD ELECTRONICALLY SIGNED 11/11/2011 21:26

## 2014-10-06 NOTE — Consult Note (Signed)
Brief Consult Note: Diagnosis: Chronic pancreatitis. ? Gastritis.   Patient was seen by consultant.   Comments: ? Acute on chronic vs exacerbation of chronic pancreatitis. Gastritis is another consideration due to his h/o drinking and ? hematemesis.  Recommendations: Continue PPI. Agree with clear liquids, advance as tolerated. Further recommendations to follow. Thanks.  Electronic Signatures: Lurline DelIftikhar, Ledarrius Beauchaine (MD)  (Signed [phone]-May-13 19:50)  Authored: Brief Consult Note   Last Updated: 30-May-13 19:50 by Lurline DelIftikhar, Silvina Hackleman (MD)

## 2014-10-06 NOTE — Consult Note (Signed)
PATIENT NAME:  Walter [name], Walter [name] MR#:  [id number] DATE OF BIRTH:  August 04, 1965  DATE OF CONSULTATION:  11/12/2011  REFERRING PHYSICIAN:  Dr. Buford DresserFinnell CONSULTING PHYSICIAN:  Lurline DelShaukat Norvell Ureste, MD  REASON FOR CONSULTATION: Pancreatitis.   HISTORY OF PRESENT ILLNESS: 49 year old male with history of chronic calcific pancreatitis. Patient was admitted with increasing abdominal pain for last 2 to 3 days. He has been using cocaine and marijuana and has been drinking quite heavy as well. His amylase and lipase was normal. CT scan of the abdomen showed calcification within the pancreas consistent with chronic pancreatitis. Some haziness was noted around the pancreas as well and possibility of acute pancreatitis was raised by the radiologist. Patient was evaluated on consultation yesterday. He has been complaining of some upper abdominal pain which is similar to the prior episodes of abdominal pain with chronic pancreatitis. Patient is also complaining of nausea and vomiting for the last 2 to 3 days, although yesterday he felt better in terms of his nausea and vomiting. Denies any diarrhea or constipation.   PAST MEDICAL HISTORY:  1. History of chronic abdominal pain secondary to chronic pancreatitis. 2. Hypertension.   PAST SURGICAL HISTORY:  1. Cholecystectomy. 2. Hernia repair.   HOME MEDICATIONS: Methadone and Percocet. According to patient he takes omeprazole and Pancrease as well.   SOCIAL HISTORY: Smokes about 1 to 2 packs of cigarette [name] day and drinks alcohol mostly over the weekend. He has also used some marijuana and cocaine lately.    FAMILY HISTORY: Unremarkable.   REVIEW OF SYSTEMS: Grossly negative.    PHYSICAL EXAMINATION:  GENERAL: Fairly well built male, does not appear to be in distress.   VITAL SIGNS: Temperature 97.9, pulse 70, respiration 18, blood pressure 112/78. Clinically he is not jaundiced.   NECK: Veins are flat.   LUNGS: Clear to auscultation bilaterally with fair air  entry.   CARDIOVASCULAR: Regular rate and rhythm.   ABDOMEN: Slightly distended abdomen. Mild to moderate tenderness in the epigastric and upper abdominal region. No rebound or guarding was noted. No hepatosplenomegaly or ascites.   EXTREMITIES: No edema.   NEUROLOGIC: Examination is unremarkable.   LABORATORY, DIAGNOSTIC, AND RADIOLOGICAL DATA: White cell count 13,000, hemoglobin 13.9, hematocrit 42, serum amylase 94, lipase 149. Liver enzymes are normal. CT scan of abdomen and pelvis as above.   ASSESSMENT AND PLAN: Patient with acute nausea, vomiting, and abdominal pain. He has history of chronic calcific pancreatitis. I doubt acute pancreatitis as his amylase and lipase are both normal and the haziness on the CT scan is only minimal. Most likely we are dealing with an exacerbation of abdominal pain secondary to underlying chronic pancreatitis versus an acute viral syndrome with nausea, vomiting and abdominal pain. Patient seems to be clinically better and tolerating liquid diet. Will manage conservatively with pain control as well as control of nausea and vomiting. Advance diet as tolerated. Will follow.   ____________________________ Lurline DelShaukat Appolonia Ackert, MD si:cms D: 11/12/2011 17:18:55 ET T: 11/12/2011 17:53:07 ET JOB#: [id number]  cc: Lurline DelShaukat Mulki Roesler, MD, <Dictator> Lurline DelSHAUKAT Hayden Mabin MD ELECTRONICALLY SIGNED 11/12/2011 18:36

## 2014-10-06 NOTE — Discharge Summary (Signed)
PATIENT NAME:  Walter [name], Walter [name] MR#:  [id number] DATE OF BIRTH:  [birth year]  DATE OF ADMISSION:  11/10/2011 DATE OF DISCHARGE:  11/12/2011  CONSULTANT: Dr. Niel HummerIftikhar.  PRIMARY CARE PHYSICIAN: Dr. Loma Senderharles Phillips.   CHIEF COMPLAINT: Nausea, vomiting, abdominal pain.   DISCHARGE DIAGNOSES:  1. Acute on chronic pancreatitis, mild.  2. Hyponatremia.  3. Hypokalemia.  4. Polysubstance abuse including cocaine and marijuana.   DISCHARGE MEDICATIONS:  1. Methadone 40 mg 2 times [name] day.  2. Hydrochlorothiazide 25 mg daily.  3. Aspirin 81 mg daily.   DIET: Low sodium.   ACTIVITY: As tolerated.   FOLLOW UP: Please follow up with your primary care physician within 1 to 2 weeks. If symptoms worsen please call your primary care physician right away.   DISPOSITION: Discharge to home.   SECONDARY DIAGNOSES:  1. Chronic pain. 2. Chronic pancreatitis. 3. Hypertension.  HISTORY OF PRESENT ILLNESS: For full details of the history and physical, please see the dictation on 11/10/2011 by Dr. Sherryll BurgerShah, but briefly this is [name] 49 year old with known history of chronic pain, chronic hepatitis and pancreatitis who presented with above chief complaint. Patient had some peripancreatic haziness around the pancreatic head which is concerning for acute pancreatitis and [name] 10 mm hypodense lesion within the pancreatic head likely representing [name] pseudocyst versus pancreatic cyst. He was admitted to the hospitalist service.   LABORATORY, DIAGNOSTIC AND RADIOLOGICAL DATA: Glucose on arrival 112, creatinine 1.04, sodium 128, potassium 3.8, GFR greater than 60, amylase 94, lipase 160. Repeat lipase on 05/30 was 149. LFTs within normal limits on arrival. Urine toxicology was positive for cocaine, cannabinoids and opiates. Initial WBC 14.6. Follow-up WBC was 13. Hemoglobin, hematocrit and platelets were within normal limits. Urinalysis showing no nitrites or leukocyte esterase, 9 RBCs and 2 WBCs. CT of abdomen with pelvis:  Peripancreatic haziness around the pancreatic head concerning for acute pancreatitis and there is [name] 10 mm hypodense lesion within the pancreatic head. Pseudocyst versus pancreatic cystic versus cystic neoplasm. Follow-up CT is recommended in three months.   HOSPITAL COURSE: Hospital course was uncomplicated. Patient was made n.p.o. initially and IV fluids with morphine was started. He was seen by Dr. Niel HummerIftikhar from GI. Patient tolerated the conservative management well. He does have history of chronic pancreatitis and had normal amylase and lipase. Currently, he is tolerating regular diet. He has not received any morphine overnight and today. He is to follow up with his primary care physician and repeat [name] CT of the abdomen for the follow-up of the hypodense lesion. He also did drink some alcohol over the weekend and his U-tox was positive for marijuana and cocaine and he admitted to them as well. Alcohol and illicit substance abuse was strongly discouraged in this patient and he is aware. At this point as he has tolerated advancement of his diet and vitals are stable he will be discharged with outpatient followup.   In regards to the hyponatremia, this was likely in the setting of dehydration and the nausea and vomiting which has resolved here. He was started on IV fluids and that is corrected. He also did have hypokalemia which has corrected with repletion. He was also started on [name] nicotine patch here.   TOTAL TIME SPENT: 35 minutes.       CODE STATUS: Patient is [name] FULL CODE.   ____________________________ Krystal EatonShayiq Corian Handley, MD sa:cms D: 11/12/2011 13:55:34 ET T: 11/12/2011 15:37:12 ET JOB#: [id number]  cc: Krystal EatonShayiq Marge Vandermeulen, MD, <Dictator> Marcine Matarharles W. Phillips Jr., MD [facility] CenterHAYIQ [facility]ulingHMADZIA  MD ELECTRONICALLY SIGNED 12/03/2011 19:58

## 2015-02-15 ENCOUNTER — Emergency Department
Admission: EM | Admit: 2015-02-15 | Discharge: 2015-02-17 | Disposition: A | Discharge status: Other Acute Inpatient Hospital | Payer: Medicare Other | Attending: Emergency Medicine | Admitting: Emergency Medicine

## 2015-02-15 ENCOUNTER — Encounter: Payer: Self-pay | Admitting: Emergency Medicine

## 2015-02-15 DIAGNOSIS — G8929 Other chronic pain: Secondary | ICD-10-CM | POA: Insufficient documentation

## 2015-02-15 DIAGNOSIS — F1020 Alcohol dependence, uncomplicated: Secondary | ICD-10-CM

## 2015-02-15 DIAGNOSIS — Z720 Tobacco use: Secondary | ICD-10-CM | POA: Insufficient documentation

## 2015-02-15 DIAGNOSIS — F32A Depression, unspecified: Secondary | ICD-10-CM

## 2015-02-15 DIAGNOSIS — F1210 Cannabis abuse, uncomplicated: Secondary | ICD-10-CM | POA: Diagnosis not present

## 2015-02-15 DIAGNOSIS — F329 Major depressive disorder, single episode, unspecified: Secondary | ICD-10-CM | POA: Insufficient documentation

## 2015-02-15 DIAGNOSIS — I10 Essential (primary) hypertension: Secondary | ICD-10-CM | POA: Insufficient documentation

## 2015-02-15 DIAGNOSIS — F1410 Cocaine abuse, uncomplicated: Secondary | ICD-10-CM | POA: Insufficient documentation

## 2015-02-15 DIAGNOSIS — F1420 Cocaine dependence, uncomplicated: Secondary | ICD-10-CM

## 2015-02-15 DIAGNOSIS — F10239 Alcohol dependence with withdrawal, unspecified: Secondary | ICD-10-CM | POA: Diagnosis present

## 2015-02-15 DIAGNOSIS — R109 Unspecified abdominal pain: Secondary | ICD-10-CM | POA: Diagnosis not present

## 2015-02-15 DIAGNOSIS — F1910 Other psychoactive substance abuse, uncomplicated: Secondary | ICD-10-CM

## 2015-02-15 HISTORY — DX: Post-traumatic stress disorder, unspecified: F43.10

## 2015-02-15 HISTORY — DX: Bipolar disorder, unspecified: F31.9

## 2015-02-15 HISTORY — DX: Essential (primary) hypertension: I10

## 2015-02-15 HISTORY — DX: Acute pancreatitis without necrosis or infection, unspecified: K85.90

## 2015-02-15 LAB — COMPREHENSIVE METABOLIC PANEL
ALT: 23 U/L (ref 17–63)
AST: 29 U/L (ref 15–41)
Albumin: 4.1 g/dL (ref 3.5–5.0)
Alkaline Phosphatase: 88 U/L (ref 38–126)
Anion gap: 10 (ref 5–15)
BUN: 16 mg/dL (ref 6–20)
CO2: 24 mmol/L (ref 22–32)
Calcium: 9.4 mg/dL (ref 8.9–10.3)
Chloride: 109 mmol/L (ref 101–111)
Creatinine, Ser: 1.39 mg/dL — ABNORMAL HIGH (ref 0.61–1.24)
GFR calc Af Amer: >60 mL/min (ref >60)
GFR calc non Af Amer: 58 mL/min — ABNORMAL LOW (ref >60)
Glucose, Bld: 102 mg/dL — ABNORMAL HIGH (ref 65–99)
Potassium: 3.5 mmol/L (ref 3.5–5.1)
Sodium: 143 mmol/L (ref 135–145)
Total Bilirubin: 0.8 mg/dL (ref 0.3–1.2)
Total Protein: 7.3 g/dL (ref 6.5–8.1)

## 2015-02-15 LAB — URINE DRUG SCREEN, QUALITATIVE (ARMC ONLY)
Amphetamines, Ur Screen: NOT DETECTED
Barbiturates, Ur Screen: NOT DETECTED
Benzodiazepine, Ur Scrn: NOT DETECTED
Cannabinoid 50 Ng, Ur ~~LOC~~: POSITIVE — AB
Cocaine Metabolite,Ur ~~LOC~~: POSITIVE — AB
MDMA (Ecstasy)Ur Screen: NOT DETECTED
Methadone Scn, Ur: NOT DETECTED
Opiate, Ur Screen: NOT DETECTED
Phencyclidine (PCP) Ur S: NOT DETECTED
Tricyclic, Ur Screen: NOT DETECTED

## 2015-02-15 LAB — ACETAMINOPHEN LEVEL: Acetaminophen (Tylenol), Serum: <10 ug/mL — ABNORMAL LOW (ref 10–30)

## 2015-02-15 LAB — ETHANOL: Alcohol, Ethyl (B): 233 mg/dL — ABNORMAL HIGH (ref <5)

## 2015-02-15 LAB — CBC
HCT: 46 % (ref 40.0–52.0)
Hemoglobin: 14.9 g/dL (ref 13.0–18.0)
MCH: 27.8 pg (ref 26.0–34.0)
MCHC: 32.5 g/dL (ref 32.0–36.0)
MCV: 85.4 fL (ref 80.0–100.0)
Platelets: 194 10*3/uL (ref 150–440)
RBC: 5.38 MIL/uL (ref 4.40–5.90)
RDW: 16.4 % — ABNORMAL HIGH (ref 11.5–14.5)
WBC: 11.3 10*3/uL — ABNORMAL HIGH (ref 3.8–10.6)

## 2015-02-15 LAB — SALICYLATE LEVEL: Salicylate Lvl: <4 mg/dL (ref 2.8–30.0)

## 2015-02-15 NOTE — ED Provider Notes (Signed)
[facility] Emergency Department Provider Note  ____________________________________________  Time seen: Approximately 12:30 AM  I have reviewed the triage vital signs and the nursing notes.   HISTORY  Chief Complaint Alcohol Problem and Drug Problem  History limited by intoxication  HPI Walter [name] is a 49 y.o. male who presents to the ED from home requesting detox from alcohol, crack cocaine and marijuana. Reports last drink was approximately 30 minutes prior to arrival. Reports drinking one half of a fifth of liquor plus a 12 pack of beer daily. Reports drug use with crack cocaine and marijuana. History of bipolar disorder and PTSD; reports he has been off his medications for one month. Reports hearing voices to harm himself 3 days ago. Currently denies SI. Complains of chronic "pancreatitis pain". Denies fever, chills, chest pain, shortness of breath, nausea, vomiting, diarrhea.   Past Medical History  Diagnosis Date  . Pancreatitis   . Hypertension   . PTSD (post-traumatic stress disorder)   . Bipolar 1 disorder     There are no active problems to display for this patient.   Past Surgical History  Procedure Laterality Date  . Cholecystectomy    . Pancreas surgery      No current outpatient prescriptions on file.  Allergies Review of patient's allergies indicates no known allergies.  No family history on file.  Social History Social History  Substance Use Topics  . Smoking status: Current Every Day Smoker    Types: Cigarettes  . Smokeless tobacco: None  . Alcohol Use: 7.2 oz/week    12 Cans of beer per week     Comment: 1/2 of a 5th a day    Review of Systems Constitutional: No fever/chills Eyes: No visual changes. ENT: No sore throat. Cardiovascular: Denies chest pain. Respiratory: Denies shortness of breath. Gastrointestinal: Positive for chronic abdominal pain.  No nausea, no vomiting.  No diarrhea.  No  constipation. Genitourinary: Negative for dysuria. Musculoskeletal: Negative for back pain. Skin: Negative for rash. Neurological: Negative for headaches, focal weakness or numbness. Psychiatric:Positive for depression, auditory hallucinations, SI.  10-point ROS otherwise negative.  ____________________________________________   PHYSICAL EXAM:  VITAL SIGNS: ED Triage Vitals  Enc Vitals Group     BP 02/15/15 2200 141/87 mmHg     Pulse Rate 02/15/15 2200 87     Resp --      Temp 02/15/15 2200 98.3 F (36.8 C)     Temp Source 02/15/15 2200 Oral     SpO2 02/15/15 2200 96 %     Weight 02/15/15 2200 175 lb (79.379 kg)     Height 02/15/15 2200 5' 10.5" (1.791 m)     Head Cir --      Peak Flow --      Pain Score 02/15/15 2207 0     Pain Loc --      Pain Edu? --      Excl. in GC? --     Constitutional: Asleep; easily awakened for exam. Alert and oriented. Well appearing and in no acute distress. Eyes: Conjunctivae are normal. PERRL. EOMI. Head: Atraumatic. Nose: No congestion/rhinnorhea. Mouth/Throat: Mucous membranes are moist.  Oropharynx non-erythematous. Neck: No stridor.   Cardiovascular: Normal rate, regular rhythm. Grossly normal heart sounds.  Good peripheral circulation. Respiratory: Normal respiratory effort.  No retractions. Lungs CTAB. Gastrointestinal: Soft and nontender. No distention. No abdominal bruits. No CVA tenderness. Musculoskeletal: No lower extremity tenderness nor edema.  No joint effusions. Neurologic:  Normal speech and language. No  gross focal neurologic deficits are appreciated. No gait instability. Skin:  Skin is warm, dry and intact. No rash noted. Psychiatric: Mood and affect are flat. Speech and behavior are normal.  ____________________________________________   LABS (all labs ordered are listed, but only abnormal results are displayed)  Labs Reviewed  COMPREHENSIVE METABOLIC PANEL - Abnormal; Notable for the following:    Glucose, Bld  102 (*)    Creatinine, Ser 1.39 (*)    GFR calc non Af Amer 58 (*)    All other components within normal limits  ETHANOL - Abnormal; Notable for the following:    Alcohol, Ethyl (B) 233 (*)    All other components within normal limits  ACETAMINOPHEN LEVEL - Abnormal; Notable for the following:    Acetaminophen (Tylenol), Serum <10 (*)    All other components within normal limits  CBC - Abnormal; Notable for the following:    WBC 11.3 (*)    RDW 16.4 (*)    All other components within normal limits  URINE DRUG SCREEN, QUALITATIVE ([facility] ONLY) - Abnormal; Notable for the following:    Cocaine Metabolite,Ur [place] POSITIVE (*)    Cannabinoid 50 Ng, Ur [place] POSITIVE (*)    All other components within normal limits  LIPASE, BLOOD - Abnormal; Notable for the following:    Lipase 19 (*)    All other components within normal limits  SALICYLATE LEVEL   ____________________________________________  EKG  None ____________________________________________  RADIOLOGY  None ____________________________________________   PROCEDURES  Procedure(s) performed: None  Critical Care performed: No  ____________________________________________   INITIAL IMPRESSION / ASSESSMENT AND PLAN / ED COURSE  Pertinent labs & imaging results that were available during my care of the patient were reviewed by me and considered in my medical decision making (see chart for details).  49 year old male with a history of bipolar disorder and PTSD seeking detox from alcohol, cocaine and marijuana. Also reports auditory hallucinations and depression with SI. Currently denies SI/HI/AH/VH. Will consult TTS to evaluate patient in the emergency department. Laboratory and urinalysis results notable for cocaine and cannabinoid.  ----------------------------------------- 1:53 AM on 02/16/2015 -----------------------------------------  TTS evaluate patient in the emergency department and recommended IVC for psychiatry  consult in the morning. Lipase was added and is within normal limits. At this time patient is medically cleared for psychiatric referral.  ----------------------------------------- 6:25 AM on 02/16/2015 -----------------------------------------  No events overnight. Patient sleeping in no acute distress. Awaiting psychiatry consult this morning. ____________________________________________   FINAL CLINICAL IMPRESSION(S) / ED DIAGNOSES  Final diagnoses:  Depression  Polysubstance abuse      Irean Hong, MD 02/16/15 [phone]

## 2015-02-15 NOTE — ED Notes (Addendum)
Pt reports he's here for detox from alcohol and crack and marijuana. Pt reports last drink was 20-30 min PTA, reports drinking 1/2 5th of liquor and 12 pack of beer a day. Last drug use was today. Pt also with hx of bipolar and PTSD, report he's been off his meds x 1 month. Reports he's been hearing voices telling him to hurt himself, reports SI 3 days ago, denies SI or HI at this time.

## 2015-02-16 DIAGNOSIS — F1420 Cocaine dependence, uncomplicated: Secondary | ICD-10-CM

## 2015-02-16 DIAGNOSIS — F332 Major depressive disorder, recurrent severe without psychotic features: Secondary | ICD-10-CM

## 2015-02-16 DIAGNOSIS — F1020 Alcohol dependence, uncomplicated: Secondary | ICD-10-CM

## 2015-02-16 LAB — LIPASE, BLOOD: Lipase: 19 U/L — ABNORMAL LOW (ref 22–51)

## 2015-02-16 MED ORDER — CHLORDIAZEPOXIDE HCL 25 MG PO CAPS
25.0000 mg | ORAL_CAPSULE | Freq: Four times a day (QID) | ORAL | Status: DC
  Administered 2015-02-17: 25 mg via ORAL
  Filled 2015-02-16: qty 1

## 2015-02-16 MED ORDER — CHLORDIAZEPOXIDE HCL 25 MG PO CAPS
ORAL_CAPSULE | ORAL | Status: AC
  Administered 2015-02-16: 25 mg via ORAL
  Filled 2015-02-16: qty 1

## 2015-02-16 MED ORDER — ADULT MULTIVITAMIN W/MINERALS CH
1.0000 | ORAL_TABLET | Freq: Every day | ORAL | Status: DC
  Administered 2015-02-16: 1 via ORAL
  Filled 2015-02-16: qty 1

## 2015-02-16 MED ORDER — HYDROCHLOROTHIAZIDE 25 MG PO TABS
25.0000 mg | ORAL_TABLET | Freq: Every day | ORAL | Status: DC
  Administered 2015-02-16: 25 mg via ORAL

## 2015-02-16 MED ORDER — HYDROXYZINE HCL 25 MG PO TABS: 25.0000 mg | ORAL_TABLET | Freq: Four times a day (QID) | ORAL | Status: DC | PRN

## 2015-02-16 MED ORDER — HYDROCHLOROTHIAZIDE 12.5 MG PO CAPS: 12.5000 mg | ORAL_CAPSULE | Freq: Two times a day (BID) | ORAL | Status: DC

## 2015-02-16 MED ORDER — LOPERAMIDE HCL 2 MG PO CAPS
2.0000 mg | ORAL_CAPSULE | ORAL | Status: DC | PRN
  Filled 2015-02-16: qty 2

## 2015-02-16 MED ORDER — CHLORDIAZEPOXIDE HCL 25 MG PO CAPS: 25.0000 mg | ORAL_CAPSULE | Freq: Every day | ORAL | Status: DC

## 2015-02-16 MED ORDER — CHLORDIAZEPOXIDE HCL 25 MG PO CAPS: 25.0000 mg | ORAL_CAPSULE | Freq: Four times a day (QID) | ORAL | Status: DC | PRN

## 2015-02-16 MED ORDER — VITAMIN B-1 100 MG PO TABS
ORAL_TABLET | ORAL | Status: AC
  Administered 2015-02-16: 100 mg via ORAL
  Filled 2015-02-16: qty 1

## 2015-02-16 MED ORDER — CHLORDIAZEPOXIDE HCL 25 MG PO CAPS: 25.0000 mg | ORAL_CAPSULE | ORAL | Status: DC

## 2015-02-16 MED ORDER — CHLORDIAZEPOXIDE HCL 25 MG PO CAPS
25.0000 mg | ORAL_CAPSULE | Freq: Once | ORAL | Status: AC
  Administered 2015-02-16: 25 mg via ORAL

## 2015-02-16 MED ORDER — ONDANSETRON 8 MG PO TBDP
4.0000 mg | ORAL_TABLET | Freq: Four times a day (QID) | ORAL | Status: DC | PRN
  Filled 2015-02-16: qty 0.5

## 2015-02-16 MED ORDER — CHLORDIAZEPOXIDE HCL 25 MG PO CAPS: 25.0000 mg | ORAL_CAPSULE | Freq: Three times a day (TID) | ORAL | Status: DC

## 2015-02-16 MED ORDER — VITAMIN B-1 100 MG PO TABS
100.0000 mg | ORAL_TABLET | Freq: Every day | ORAL | Status: DC
  Administered 2015-02-16: 100 mg via ORAL

## 2015-02-16 MED ORDER — FLUOXETINE HCL 20 MG PO CAPS: 20.0000 mg | ORAL_CAPSULE | Freq: Every day | ORAL | Status: DC

## 2015-02-16 MED ORDER — HYDROCHLOROTHIAZIDE 25 MG PO TABS
ORAL_TABLET | ORAL | Status: AC
  Administered 2015-02-16: 25 mg via ORAL
  Filled 2015-02-16: qty 1

## 2015-02-16 NOTE — BH Assessment (Addendum)
Assessment Note  Walter [name] is an 49 y.o. male. Pt presented as a poor historian and provided limited information during assessment.  Pt reported history of alcohol use 2x/week but, was unable to provide amount stating: "I don't know how much, I just drink. I don't know how much". Pt reported "not much" cocaine use occurring 1x/month. Pt reported no additional drug use.  Writer reviewed RN triage note and obtained the following information regarding SA hx:   02/15/15 Triage: Pt reports he's here for detox from alcohol and crack and marijuana. Pt reports last drink was 20-30 min PTA, reports drinking 1/2 5th of liquor and 12 pack of beer a day. Last drug use was today.   *Pt is also currently noted non-complaint w/medication ______  Pt endorses no sxs of depression. Pt reports that he is a widow (wife committed suicide). Pt reports hx of PTSD and bipolar d/o. Pt reports SI "a couple days ago" with plan to drink antifreeze. Pt reports history of suicide attempt in 2015. Pt denied A/V/T/OH however, reported to triage note that he has been "hearing voices telling him to hurt himself". Pt endorses HI/thoughts of harm occurring "a couple days ago. Pt stated "I felt like I need to hurt somebody, needed to hit somebody".   Pt reports no hx of abuse, trauma or self-injurious behaviors.  Writer consulted with EDP Dr.Sung and Pt is to be referred to Psych MD for consult.    Axis I: PTSD, Bipolar 1 d/o  Past Medical History:  Past Medical History  Diagnosis Date  . Pancreatitis   . Hypertension   . PTSD (post-traumatic stress disorder)   . Bipolar 1 disorder     Past Surgical History  Procedure Laterality Date  . Cholecystectomy    . Pancreas surgery      Family History: No family history on file.  Social History:  reports that he has been smoking Cigarettes.  He does not have any smokeless tobacco history on file. He reports that he drinks about 7.2 oz of alcohol per week. He reports that  he uses illicit drugs (Cocaine and Marijuana).  Additional Social History:  Alcohol / Drug Use Pain Medications: None Reported Prescriptions: None Reported Over the Counter: None Reported History of alcohol / drug use?: Yes Longest period of sobriety (when/how long): Not Reported Negative Consequences of Use:  (Not Reported) Withdrawal Symptoms:  (Pt denied w/drawl sxs) Substance #1 Name of Substance 1: Alcohol 1 - Age of First Use: 13 1 - Amount (size/oz): Pt. reports 2x/day  1 - Frequency: 2x/wk 1 - Duration: Not Reported 1 - Last Use / Amount: 02/15/2015 Substance #2 Name of Substance 2: Cocaine 2 - Age of First Use: 33 2 - Amount (size/oz): "not much" 2 - Frequency: 1x/month 2 - Duration: Not Reported 2 - Last Use / Amount: 02/15/2015  CIWA: CIWA-Ar BP: (!) 141/87 mmHg Pulse Rate: 87 COWS:    Allergies: No Known Allergies  Home Medications:  (Not in a hospital admission)  OB/GYN Status:  No LMP for male patient.  General Assessment Data Location of Assessment: [facility] ED TTS Assessment: In system Is this a Tele or Face-to-Face Assessment?: Face-to-Face Is this an Initial Assessment or a Re-assessment for this encounter?: Initial Assessment Marital status: Widowed (Wife Committed Suicde) Ethete name: N/A Is patient pregnant?: No Pregnancy Status: No Living Arrangements: Other (Comment) (Not Provided) Can pt return to current living arrangement?:  (UTA) Admission Status: Voluntary Is patient capable of signing voluntary admission?:  Yes Referral Source: Self/Family/Friend Insurance type: Medicare     Crisis Care Plan Living Arrangements: Other (Comment) (Not Provided) Name of Psychiatrist: None Reported Name of Therapist: None Reported  Education Status Is patient currently in school?:  (UTA) Current Grade: UTA Highest grade of school patient has completed: Unknown Name of school: N/A Contact person: None Provided  Risk to self with the past 6  months Suicidal Ideation:  (UTA if current) Has patient been a risk to self within the past 6 months prior to admission? : No Suicidal Intent:  (UTA) Has patient had any suicidal intent within the past 6 months prior to admission? :  (UTA) Is patient at risk for suicide?: Yes Suicidal Plan?: Yes-Currently Present Has patient had any suicidal plan within the past 6 months prior to admission? : Other (comment) (Unknown) Specify Current Suicidal Plan: Drink antifreeze Access to Means:  (UTA however access is likely) What has been your use of drugs/alcohol within the last 12 months?: Pt reported to writer Alcohol consumption 2x/wk & cocaine use 1x/mth however, reported crack cocaine, marijuana and daily alcohol consumptions upon RN triage Previous Attempts/Gestures: Yes How many times?: 1 Other Self Harm Risks: UTA Triggers for Past Attempts: Unknown Intentional Self Injurious Behavior: None Family Suicide History: Yes (Wife) Recent stressful life event(s):  (UTA) Persecutory voices/beliefs?:  Rich Reining) Depression:  (UTA) Depression Symptoms:  (UTA) Substance abuse history and/or treatment for substance abuse?: Yes Suicide prevention information given to non-admitted patients: Not applicable  Risk to Others within the past 6 months Homicidal Ideation: Yes-Currently Present ("a few days ago", "I felt like I need to hurt somebody, need) Does patient have any lifetime risk of violence toward others beyond the six months prior to admission? : Unknown Thoughts of Harm to Others: Yes-Currently Present ("a few days ago", "I felt like I need to hurt somebody, need) Comment - Thoughts of Harm to Others: "a few days ago", "I felt like I need to hurt somebody, need Current Homicidal Intent:  (UTA) Current Homicidal Plan:  (UTA, "a few days ago", "I felt like I need to hurt somebody,) Access to Homicidal Means:  (UTA) Identified Victim: None Identified History of harm to others?:  (Unknown) Assessment  of Violence: None Noted Violent Behavior Description: N/A Does patient have access to weapons?: No (Pt denies) Criminal Charges Pending?: No Does patient have a court date: No Is patient on probation?: No  Psychosis Hallucinations: Auditory (Pt reported no AH to Clinical research associate but endorsed during RN triage) Delusions: None noted  Mental Status Report Appearance/Hygiene: In scrubs Eye Contact: Poor Motor Activity: Unremarkable Speech: Aggressive Level of Consciousness: Irritable Mood: Irritable Affect: Irritable Anxiety Level: Minimal Thought Processes: Relevant Judgement: Unable to Assess Orientation: Person, Place Obsessive Compulsive Thoughts/Behaviors: None  Cognitive Functioning Concentration: Unable to Assess Memory: Remote Intact, Unable to Assess IQ: Average Insight: Unable to Assess Impulse Control: Unable to Assess Appetite:  (UTA) Weight Loss: 0 Weight Gain: 0 Sleep: Unable to Assess Total Hours of Sleep:  (UTA) Vegetative Symptoms: Unable to Assess  ADLScreening [facility] Assessment Services) Patient's cognitive ability adequate to safely complete daily activities?:  (UTA) Patient able to express need for assistance with ADLs?: Yes Independently performs ADLs?: Yes (appropriate for developmental age)  Prior Inpatient Therapy Prior Inpatient Therapy: Yes Prior Therapy Dates: 2015 Prior Therapy Facilty/Provider(s): [facility] Reason for Treatment: Suicide Attempt  Prior Outpatient Therapy Prior Outpatient Therapy: No Prior Therapy Dates: N/A Prior Therapy Facilty/Provider(s): N/A Reason for Treatment: N/A Does patient have an ACCT team?:  Unknown Does patient have Intensive In-House Services?  : Unknown Does patient have [facility] services? : Unknown Does patient have P4CC services?: Unknown  ADL Screening (condition at time of admission) Patient's cognitive ability adequate to safely complete daily activities?:  (UTA) Is the patient deaf or have difficulty hearing?:  No Does the patient have difficulty seeing, even when wearing glasses/contacts?:  (UTA) Does the patient have difficulty concentrating, remembering, or making decisions?:  (UTA) Patient able to express need for assistance with ADLs?: Yes Does the patient have difficulty dressing or bathing?: No Independently performs ADLs?: Yes (appropriate for developmental age) Does the patient have difficulty walking or climbing stairs?: No Weakness of Legs: None Weakness of Arms/Hands: None  Home Assistive Devices/Equipment Home Assistive Devices/Equipment: None (None Reported)  Therapy Consults (therapy consults require a physician order) PT Evaluation Needed: No OT Evalulation Needed: No SLP Evaluation Needed: No Abuse/Neglect Assessment (Assessment to be complete while patient is alone) Physical Abuse: Denies Verbal Abuse: Denies Sexual Abuse: Denies Exploitation of patient/patient's resources: Denies Self-Neglect: Denies Values / Beliefs Cultural Requests During Hospitalization: None Spiritual Requests During Hospitalization: None Consults Spiritual Care Consult Needed: No Social Work Consult Needed: No Merchant navy officer (For Healthcare) Does patient have an advance directive?: No Would patient like information on creating an advanced directive?: No - patient declined information    Additional Information 1:1 In Past 12 Months?:  (UTA) CIRT Risk: No Elopement Risk: No     Disposition:  Disposition Initial Assessment Completed for this Encounter: Yes Disposition of Patient: Other dispositions (Psych MD consult)  On Site Evaluation by:   Reviewed with Physician:    Jonerik Sliker J [place] 02/16/2015 6:00 AM

## 2015-02-16 NOTE — ED Notes (Signed)
Pt. Denies SI or HI thoughts right now.  Pt. States he is here for detox from ETOH and cocaine.  Pt. States his last drink was 30 minutes before arriving to ED.  Pt. States his wife committed suicide.  Pt. States his mother is supportive of him. Pt. States he drinks two times per week and uses cocaine once a month. Pt. States he was an inpatient here last year.

## 2015-02-16 NOTE — ED Notes (Signed)
BEHAVIORAL HEALTH ROUNDING  Patient sleeping: Yes.  Patient alert and oriented: no  Behavior appropriate: Yes. ; If no, describe:  Nutrition and fluids offered: No  Toileting and hygiene offered: No  Sitter present: no  Law enforcement present: Yes   

## 2015-02-16 NOTE — ED Notes (Signed)
NAD noted at time of assessment. Pt laying in bed watching TV. Respirations even and unlabored at this time.

## 2015-02-16 NOTE — ED Notes (Signed)
BEHAVIORAL HEALTH ROUNDING Patient sleeping: Yes.   Patient alert and oriented: pt sleeping Behavior appropriate: Yes.  ; If no, describe: Nutrition and fluids offered: Yes  Toileting and hygiene offered: Yes  Sitter present: no Law enforcement present: Yes  

## 2015-02-16 NOTE — ED Notes (Signed)
NAD noted at this time. Pt resting in bed watching TV. Respirations even and unlabored at this time.

## 2015-02-16 NOTE — ED Notes (Signed)
ED BHU PLACEMENT JUSTIFICATION Is the patient under IVC or is there intent for IVC: Yes.   Is the patient medically cleared: Yes.   Is there vacancy in the ED BHU: Yes.   Is the population mix appropriate for patient: Yes.   Is the patient awaiting placement in inpatient or outpatient setting: Yes.   Has the patient had a psychiatric consult: Yes.   Survey of unit performed for contraband, proper placement and condition of furniture, tampering with fixtures in bathroom, shower, and each patient room: Yes.  ; Findings:  APPEARANCE/BEHAVIOR calm, cooperative and adequate rapport can be established NEURO ASSESSMENT Orientation: time, place and person Hallucinations: Yes.  Auditory Hallucinations Pt states he was hearing command hallucinations but was unable to states what they were telling him to do. Speech: Normal Gait: normal RESPIRATORY ASSESSMENT Normal expansion.  Clear to auscultation.  No rales, rhonchi, or wheezing. CARDIOVASCULAR ASSESSMENT regular rate and rhythm, S1, S2 normal, no murmur, click, rub or gallop GASTROINTESTINAL ASSESSMENT soft, nontender, BS WNL, no r/g EXTREMITIES normal strength, tone, and muscle mass PLAN OF CARE Provide calm/safe environment. Vital signs assessed twice daily. ED BHU Assessment once each 12-hour shift. Collaborate with intake RN daily or as condition indicates. Assure the ED provider has rounded once each shift. Provide and encourage hygiene. Provide redirection as needed. Assess for escalating behavior; address immediately and inform ED provider.  Assess family dynamic and appropriateness for visitation as needed: Yes.  ; If necessary, describe findings:  Educate the patient/family about BHU procedures/visitation: Yes.  ; If necessary, describe findings:

## 2015-02-16 NOTE — ED Notes (Signed)
NAD noted at this time. Pt resting in bed watching TV. Pt is awake at this time.

## 2015-02-16 NOTE — ED Notes (Signed)
Pt in NAD. Pt noted to the laying in bed. Respirations even and unlabored at this time. Pt responsive to verbal stimulation.
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
## 2015-02-16 NOTE — ED Notes (Signed)

## 2015-02-16 NOTE — ED Notes (Signed)
BEHAVIORAL HEALTH ROUNDING Patient sleeping: No. Patient alert and oriented: yes Behavior appropriate: Yes.  ; If no, describe:  Nutrition and fluids offered: Yes Toileting and hygiene offered: Yes  Sitter present: yes Law enforcement present: Yes ODS  

## 2015-02-16 NOTE — ED Notes (Signed)
ED BHU PLACEMENT JUSTIFICATION Is the patient under IVC or is there intent for IVC: Yes.   Is the patient medically cleared: Yes.   Is there vacancy in the ED BHU: Yes.   Is the population mix appropriate for patient: Yes.   Is the patient awaiting placement in inpatient or outpatient setting: Yes.   Has the patient had a psychiatric consult: No. Survey of unit performed for contraband, proper placement and condition of furniture, tampering with fixtures in bathroom, shower, and each patient room: Yes.  ; Findings: all clear APPEARANCE/BEHAVIOR calm, cooperative and adequate rapport can be established NEURO ASSESSMENT Orientation: time, place and person Hallucinations: No.None noted (Hallucinations) Speech: Normal Gait: normal RESPIRATORY ASSESSMENT Normal expansion.  Clear to auscultation.  No rales, rhonchi, or wheezing. CARDIOVASCULAR ASSESSMENT regular rate and rhythm, S1, S2 normal, no murmur, click, rub or gallop GASTROINTESTINAL ASSESSMENT soft, nontender, BS WNL, no r/g EXTREMITIES normal strength, tone, and muscle mass, ROM of all joints is normal PLAN OF CARE Provide calm/safe environment. Vital signs assessed twice daily. ED BHU Assessment once each 12-hour shift. Collaborate with intake RN daily or as condition indicates. Assure the ED provider has rounded once each shift. Provide and encourage hygiene. Provide redirection as needed. Assess for escalating behavior; address immediately and inform ED provider.  Assess family dynamic and appropriateness for visitation as needed: Yes.  ; If necessary, describe findings:  Educate the patient/family about BHU procedures/visitation: Yes.  ; If necessary, describe findings:  

## 2015-02-16 NOTE — ED Notes (Signed)
Patient is resting comfortably. Meal given

## 2015-02-16 NOTE — ED Notes (Signed)
NAD noted at this time. Pt resting in bed with lights off, TV on. Respirations even and unlabored.

## 2015-02-16 NOTE — ED Notes (Signed)
BEHAVIORAL HEALTH ROUNDING Patient sleeping: No. Patient alert and oriented: yes Behavior appropriate: Yes.  ; If no, describe:  Nutrition and fluids offered: Yes  Toileting and hygiene offered: Yes  Sitter present: yes Law enforcement present: Yes  

## 2015-02-16 NOTE — ED Notes (Signed)
Patient assigned to appropriate care area. Patient oriented to unit/care area: Informed that, for their safety, care areas are designed for safety and monitored by security cameras at all times; and visiting hours explained to patient. Patient verbalizes understanding, and verbal contract for safety obtained. 

## 2015-02-16 NOTE — ED Notes (Signed)
BEHAVIORAL HEALTH ROUNDING Patient sleeping: Yes.   Patient alert and oriented: yes Behavior appropriate: Yes.  ; If no, describe:  Nutrition and fluids offered: Yes  Toileting and hygiene offered: Yes  Sitter present: no Law enforcement present: Yes  

## 2015-02-16 NOTE — ED Notes (Signed)
First call for report, RN to call back

## 2015-02-17 ENCOUNTER — Encounter: Payer: Self-pay | Admitting: Behavioral Health

## 2015-02-17 ENCOUNTER — Inpatient Hospital Stay
Admission: AD | Admit: 2015-02-17 | Discharge: 2015-02-25 | DRG: 885 | Disposition: A | Discharge status: Home / Self Care | Payer: Medicare Other | Source: Intra-hospital | Attending: Psychiatry | Admitting: Psychiatry

## 2015-02-17 DIAGNOSIS — F1094 Alcohol use, unspecified with alcohol-induced mood disorder: Secondary | ICD-10-CM | POA: Diagnosis not present

## 2015-02-17 DIAGNOSIS — R45851 Suicidal ideations: Secondary | ICD-10-CM | POA: Diagnosis present

## 2015-02-17 DIAGNOSIS — Z9114 Patient's other noncompliance with medication regimen: Secondary | ICD-10-CM | POA: Diagnosis present

## 2015-02-17 DIAGNOSIS — F1220 Cannabis dependence, uncomplicated: Secondary | ICD-10-CM | POA: Diagnosis present

## 2015-02-17 DIAGNOSIS — F4310 Post-traumatic stress disorder, unspecified: Secondary | ICD-10-CM | POA: Diagnosis present

## 2015-02-17 DIAGNOSIS — Z9889 Other specified postprocedural states: Secondary | ICD-10-CM

## 2015-02-17 DIAGNOSIS — F332 Major depressive disorder, recurrent severe without psychotic features: Secondary | ICD-10-CM | POA: Diagnosis present

## 2015-02-17 DIAGNOSIS — F322 Major depressive disorder, single episode, severe without psychotic features: Secondary | ICD-10-CM

## 2015-02-17 DIAGNOSIS — F32A Depression, unspecified: Secondary | ICD-10-CM | POA: Diagnosis present

## 2015-02-17 DIAGNOSIS — F1024 Alcohol dependence with alcohol-induced mood disorder: Secondary | ICD-10-CM | POA: Diagnosis present

## 2015-02-17 DIAGNOSIS — F419 Anxiety disorder, unspecified: Secondary | ICD-10-CM | POA: Diagnosis present

## 2015-02-17 DIAGNOSIS — F10939 Alcohol use, unspecified with withdrawal, unspecified: Secondary | ICD-10-CM

## 2015-02-17 DIAGNOSIS — F515 Nightmare disorder: Secondary | ICD-10-CM | POA: Diagnosis present

## 2015-02-17 DIAGNOSIS — G4700 Insomnia, unspecified: Secondary | ICD-10-CM | POA: Diagnosis present

## 2015-02-17 DIAGNOSIS — F1420 Cocaine dependence, uncomplicated: Secondary | ICD-10-CM | POA: Diagnosis present

## 2015-02-17 DIAGNOSIS — F10239 Alcohol dependence with withdrawal, unspecified: Secondary | ICD-10-CM | POA: Diagnosis present

## 2015-02-17 DIAGNOSIS — I10 Essential (primary) hypertension: Secondary | ICD-10-CM | POA: Diagnosis present

## 2015-02-17 DIAGNOSIS — K861 Other chronic pancreatitis: Secondary | ICD-10-CM | POA: Diagnosis present

## 2015-02-17 DIAGNOSIS — F17200 Nicotine dependence, unspecified, uncomplicated: Secondary | ICD-10-CM

## 2015-02-17 DIAGNOSIS — Z9049 Acquired absence of other specified parts of digestive tract: Secondary | ICD-10-CM | POA: Diagnosis present

## 2015-02-17 DIAGNOSIS — F17210 Nicotine dependence, cigarettes, uncomplicated: Secondary | ICD-10-CM | POA: Diagnosis present

## 2015-02-17 DIAGNOSIS — F329 Major depressive disorder, single episode, unspecified: Secondary | ICD-10-CM | POA: Diagnosis not present

## 2015-02-17 DIAGNOSIS — F1020 Alcohol dependence, uncomplicated: Secondary | ICD-10-CM | POA: Diagnosis present

## 2015-02-17 DIAGNOSIS — K739 Chronic hepatitis, unspecified: Secondary | ICD-10-CM | POA: Diagnosis present

## 2015-02-17 MED ORDER — IBUPROFEN 600 MG PO TABS
600.0000 mg | ORAL_TABLET | Freq: Four times a day (QID) | ORAL | Status: DC | PRN
  Administered 2015-02-18: 600 mg via ORAL
  Filled 2015-02-17: qty 1

## 2015-02-17 MED ORDER — LORAZEPAM 2 MG PO TABS: 2.0000 mg | ORAL_TABLET | Freq: Three times a day (TID) | ORAL | Status: DC | PRN

## 2015-02-17 MED ORDER — LORAZEPAM 2 MG PO TABS
2.0000 mg | ORAL_TABLET | Freq: Once | ORAL | Status: AC
  Administered 2015-02-17: 2 mg via ORAL
  Filled 2015-02-17: qty 1

## 2015-02-17 MED ORDER — TRAZODONE HCL 100 MG PO TABS
100.0000 mg | ORAL_TABLET | Freq: Every evening | ORAL | Status: DC | PRN
  Administered 2015-02-17 (×2): 100 mg via ORAL
  Filled 2015-02-17 (×2): qty 1

## 2015-02-17 MED ORDER — ACETAMINOPHEN 325 MG PO TABS
650.0000 mg | ORAL_TABLET | Freq: Four times a day (QID) | ORAL | Status: DC | PRN
  Administered 2015-02-17 – 2015-02-19 (×3): 650 mg via ORAL
  Filled 2015-02-17 (×3): qty 2

## 2015-02-17 MED ORDER — ALUM & MAG HYDROXIDE-SIMETH 200-200-20 MG/5ML PO SUSP: 30.0000 mL | ORAL | Status: DC | PRN

## 2015-02-17 MED ORDER — HYDROCHLOROTHIAZIDE 25 MG PO TABS
25.0000 mg | ORAL_TABLET | Freq: Every day | ORAL | Status: DC
  Administered 2015-02-17 – 2015-02-25 (×9): 25 mg via ORAL
  Filled 2015-02-17 (×9): qty 1

## 2015-02-17 MED ORDER — NICOTINE 21 MG/24HR TD PT24: 21.0000 mg | MEDICATED_PATCH | Freq: Every day | TRANSDERMAL | Status: DC

## 2015-02-17 MED ORDER — SERTRALINE HCL 25 MG PO TABS
25.0000 mg | ORAL_TABLET | Freq: Every day | ORAL | Status: DC
  Administered 2015-02-17 – 2015-02-20 (×4): 25 mg via ORAL
  Filled 2015-02-17 (×4): qty 1

## 2015-02-17 MED ORDER — NICOTINE 21 MG/24HR TD PT24
21.0000 mg | MEDICATED_PATCH | Freq: Every day | TRANSDERMAL | Status: DC
  Administered 2015-02-17 – 2015-02-25 (×9): 21 mg via TRANSDERMAL
  Filled 2015-02-17 (×9): qty 1

## 2015-02-17 MED ORDER — CHLORDIAZEPOXIDE HCL 25 MG PO CAPS
50.0000 mg | ORAL_CAPSULE | Freq: Three times a day (TID) | ORAL | Status: DC
  Administered 2015-02-17 – 2015-02-19 (×7): 50 mg via ORAL
  Filled 2015-02-17 (×7): qty 2

## 2015-02-17 MED ORDER — MAGNESIUM HYDROXIDE 400 MG/5ML PO SUSP: 30.0000 mL | Freq: Every day | ORAL | Status: DC | PRN
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
## 2015-02-17 NOTE — BHH Group Notes (Signed)
[facility] LCSW Group Therapy  02/17/2015 2:45 PM  Type of Therapy:  Group Therapy  Participation Level:  Did Not Attend   Lulu Riding, MSW, LCSWA 02/17/2015, 2:45 PM

## 2015-02-17 NOTE — Progress Notes (Signed)
Patient has been in his room most of the shift and not attended any groups. He states he is still feeling very depressed but denies SI/HI or hallucination. Affect is constricted. Smiles at times. Appetite is fair. Has some sx of ETOH withdrawal, such as tremors and anxiety. CIWA score was 10. He received scheduled Librium. Will continue to monitor.

## 2015-02-17 NOTE — Progress Notes (Signed)
   02/17/15 1235  Clinical Encounter Type  Visited With Patient  Visit Type Initial  Referral From Nurse  Consult/Referral To Chaplain  Received order to speak to patient, but patient declined to speak to me.  Admitted he "had a lot going on", but did not wish to speak to a chaplain about it.  [employer] Winola Drum-pager [phone]

## 2015-02-17 NOTE — BHH Suicide Risk Assessment (Signed)
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 Tobacco use disorder [Z72.0] 02/17/2015  . Pancreatitis, chronic [K86.1] 02/17/2015  . HTN (hypertension) [I10] 02/17/2015  . Alcohol withdrawal [F10.239] 02/17/2015  . 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 PTSD (post-traumatic stress disorder) [F43.10] 02/17/2015  . Major depressive disorder, recurrent episode, moderate [F33.1] 02/17/2015  . Alcohol use disorder, severe, dependence [F10.20] 02/16/2015  . Cocaine use disorder, moderate, dependence [F14.20] 02/16/2015     Continued Clinical Symptoms:  Alcohol Use Disorder Identification Test Final Score (AUDIT): 33 The "Alcohol Use Disorders Identification Test", Guidelines for Use in Primary Care, Second Edition.  World Science writer [facility]). Score between 0-7:  no or low risk or alcohol related problems. Score between 8-15:  moderate risk of alcohol related problems. Score between 16-19:  high risk of alcohol related problems. Score 20 or above:  warrants further diagnostic evaluation for alcohol dependence and treatment.   CLINICAL FACTORS:   Severe Anxiety and/or Agitation Depression:   Comorbid alcohol abuse/dependence Hopelessness Insomnia Severe Alcohol/Substance Abuse/Dependencies Previous Psychiatric Diagnoses and Treatments Medical Diagnoses and  Treatments/Surgeries    Psychiatric Specialty Exam: Physical Exam  ROS   COGNITIVE FEATURES THAT CONTRIBUTE TO RISK:  None    SUICIDE RISK:   Moderate:  Frequent suicidal ideation with limited intensity, and duration, some specificity in terms of plans, no associated intent, good self-control, limited dysphoria/symptomatology, some risk factors present, and identifiable protective factors, including available and accessible social support.  PLAN OF CARE: admit to [facility]  Medical Decision Making:  Established Problem, Worsening (2)  I certify that inpatient services furnished can reasonably be expected to improve the patient's condition.   Jimmy Footman 02/17/2015, 12:45 PM

## 2015-02-17 NOTE — ED Notes (Signed)
BEHAVIORAL HEALTH ROUNDING Patient sleeping: Yes.   Patient alert and oriented: not applicable Behavior appropriate: Yes.  ; If no, describe:   Nutrition and fluids offered: No Toileting and hygiene offered: No Sitter present: no Law enforcement present: Yes  and ODS    

## 2015-02-17 NOTE — Progress Notes (Signed)
Recreation Therapy Notes  Date: 09.05.16 Time: 3:00 pm Location: Craft Room  Group Topic: Wellness  Goal Area(s) Addresses:  Patient will identify at least one item per dimension of health. Patient will examine areas they are deficient in.  Behavioral Response: Did not attend  Intervention: 6 Dimensions of Health  Activity: Patients were given a worksheet with the definitions of the 6 dimensions of health. Patients were given a worksheet with the 6 dimensions on it and instructed to list 2-3 things per each item.   Education: LRT educated patients on how the activity was related to their admission and d/c.   Education Outcome: Patient did not attend group.  Clinical Observations/Feedback: Patient did not attend group.  Layia Walla M, LRT/CTRS 02/17/2015 4:11 PM 

## 2015-02-17 NOTE — ED Notes (Addendum)
BEHAVIORAL HEALTH ROUNDING Patient sleeping: No. Patient alert and oriented: yes Behavior appropriate: Yes.  ; If no, describe: pt watching TV Nutrition and fluids offered: Yes  Toileting and hygiene offered: Yes  Sitter present: no Law enforcement present: Yes  and ODS   ED BHU PLACEMENT JUSTIFICATION Is the patient under IVC or is there intent for IVC: Yes.   Is the patient medically cleared: Yes.   Is there vacancy in the ED BHU: Yes.   Is the population mix appropriate for patient: Yes.   Is the patient awaiting placement in inpatient or outpatient setting: Yes.   Has the patient had a psychiatric consult: No. Survey of unit performed for contraband, proper placement and condition of furniture, tampering with fixtures in bathroom, shower, and each patient room: Yes.  ; Findings: all clear APPEARANCE/BEHAVIOR calm and cooperative NEURO ASSESSMENT Orientation: time, place and person Hallucinations: No.None noted (Hallucinations) Speech: Normal Gait: normal RESPIRATORY ASSESSMENT Normal expansion.  Clear to auscultation.  No rales, rhonchi, or wheezing. CARDIOVASCULAR ASSESSMENT regular rate and rhythm, S1, S2 normal, no murmur, click, rub or gallop GASTROINTESTINAL ASSESSMENT soft, nontender, BS WNL, no r/g EXTREMITIES normal strength, tone, and muscle mass PLAN OF CARE Provide calm/safe environment. Vital signs assessed twice daily. ED BHU Assessment once each 12-hour shift. Collaborate with intake RN daily or as condition indicates. Assure the ED provider has rounded once each shift. Provide and encourage hygiene. Provide redirection as needed. Assess for escalating behavior; address immediately and inform ED provider.  Assess family dynamic and appropriateness for visitation as needed: No.; If necessary, describe findings:   Educate the patient/family about BHU procedures/visitation: No.; If necessary, describe findings:    ENVIRONMENTAL ASSESSMENT Potentially harmful  objects out of patient reach: Yes.   Personal belongings secured: Yes.   Patient dressed in hospital provided attire only: Yes.   Plastic bags out of patient reach: Yes.   Patient care equipment (cords, cables, call bells, lines, and drains) shortened, removed, or accounted for: Yes.   Equipment and supplies removed from bottom of stretcher: Yes.   Potentially toxic materials out of patient reach: Yes.   Sharps container removed or out of patient reach: Yes.

## 2015-02-17 NOTE — BHH Group Notes (Signed)
[facility] LCSW Aftercare Discharge Planning Group Note   02/17/2015 11:49 AM  Participation Quality:   Patient did not attend group.   Lulu Riding, MSW, LCSWA

## 2015-02-17 NOTE — H&P (Addendum)
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 Tobacco use disorder [Z72.0] 02/17/2015  . Pancreatitis, chronic [K86.1] 02/17/2015  . HTN (hypertension) [I10] 02/17/2015  . Alcohol withdrawal [F10.239] 02/17/2015  . 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 PTSD (post-traumatic stress disorder) [F43.10] 02/17/2015  . Major depressive disorder, recurrent episode, moderate [F33.1] 02/17/2015  . Alcohol use disorder, severe, dependence [F10.20] 02/16/2015  . Cocaine use disorder, moderate, dependence [F14.20] 02/16/2015   History of Present Illness: Walter [name] is a 49 y.o. male patient admitted with a h/o polysubstance use (alcohol, crack cocaine and marijuana) who presented to the [facility] ED for detoxification from the aforementioned substances. Per ED physicians notes, the pt's last alcohol use was 30 minutes prior to arrival. Admission ethanol level on 02-15-15 at 10:57pm was 233. He drinks half a fifth of liquor along a 12 pack of beer daily. He reports a h/o bipolar d/o and PTSD and has been off of his medications for about 1 month. Reports command AH telling him to harm himself starting 3 days ago.   Mr. Attar endorsed the above and also noted his wife committed suicide in  June 2009. He was diagnosed with posttraumatic stress disorder as he was the one that found his wife dead.  He reports having flashbacks, nightmares and hypervigilant and describes symptoms of hyperarousal since her dead.   His current stressors include the death of his wife by suicide. He notes her birthday is July 11. He also discussed living with his sister which is a source of  stress. They argue frequently about finances.   Substance abuse history: Patient states he has been using alcohol since the age of 56. He had has been drinking a fifth of a bottle of wine a day. He also smokes crack every other day since the age of 83. He is smokes at least 1 pack of cigarettes a day and smokes marijuana every day.  His longest sobriety was 6 months, earlier this year after attending rehab in [place] from January to July. After discharge from rehabilitation patient relapsed immediately after.  He was admitted at [facility] in 2015. States that usually during close he develops tremors but no seizures.     HPI: See above HPI Elements: Quality: worsening. Severity: severe. Timing: the past several days. Duration: since he was a teenager. Context: death of wife by suicide .  Past psychiatric history: Patient reported prior hospitalizations at least twice before here in 2014 and 2015. He was also hospitalized on opiates in her last year. The patient has been to detox several times. He was for 6 months in [place] (progressive) from January to July of this year but relapsed immediately after.  He was in [place] last year. As far as suicidal attempts patient reports that last year his brother walking on him when he was trying to drink antifreeze.  Denies any other suicidal attempts.  Patient states he's been diagnosed with depression and PTSD. The PTSD developed after he found his wife did to suicide. The patient used to go to [facility] has not been seen there in several months.  Past Medical History: History of chronic pancreatitis. He used to follow up at [facility] but has not been seen by a primary  care provider in several months. States that in the past he received visits them at times for pain.  Patient also suffers from hypertension for which he was prescribed with hydrochlorothiazide which he has not been taking.  Patient has been hospitalized twice before at [facility] for  complications of pancreatitis for which he required laparotomies. Past Medical History  Diagnosis Date  . Pancreatitis   . Hypertension   . PTSD (post-traumatic stress disorder)   . Bipolar 1 disorder     Past Surgical History  Procedure Laterality Date  . Cholecystectomy    . Pancreas surgery     Family History: Denies family history of mental illness, substance abuse or suicide  Social History: Patient is currently disabled due to chronic hepatitis he is living with his mother and sister. The patient is a widow his wife committed suicide in 2009. The patient has one child but they aren't close. Patient has a 12th grade education. 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  Denies any history of legal problems. History  Alcohol Use  . 7.2 oz/week  . 12 Cans of beer per week    Comment: 1/2 of a 5th a day     History  Drug Use  . Yes  . Special: Cocaine, Marijuana    Comment: uses daily    Social History   Social History  . Marital Status: Widowed    Spouse Name: N/A  . Number of Children: N/A  . Years of Education: N/A   Social History Main Topics  . Smoking status: Current Every Day Smoker -- 0.50 packs/day for 20 years    Types: Cigarettes  . Smokeless tobacco: None  . Alcohol Use: 7.2 oz/week    12 Cans of beer per week     Comment: 1/2 of a 5th a day  . Drug Use: Yes    Special: Cocaine, Marijuana     Comment: uses daily  . Sexual Activity: Yes    Birth Control/ Protection: None   Other Topics Concern  . None   Social History Narrative    Musculoskeletal: Strength & Muscle Tone: within normal limits Gait & Station: normal Patient leans: N/A  Psychiatric Specialty Exam: Physical Exam  Review of Systems  Constitutional: Negative.   HENT: Negative.   Eyes: Negative.   Respiratory: Negative.   Cardiovascular: Negative.   Gastrointestinal: Positive for abdominal pain.  Musculoskeletal: Positive for  back pain.  Skin: Negative.   Neurological: Negative.   Psychiatric/Behavioral: Positive for depression and substance abuse. Negative for suicidal ideas.    Blood pressure 147/97, pulse 67, temperature 98.4 F (36.9 C), temperature source Oral, resp. rate 20, height '5\' 10"'  (1.778 m), weight 78.472 kg (173 lb), SpO2 100 %.Body mass index is 24.82 kg/(m^2).  General Appearance: Disheveled  Eye Sport and exercise psychologist::  Fair  Speech:  Clear and Coherent  Volume:  Decreased  Mood:  Dysphoric  Affect:  Blunt  Thought Process:  Linear  Orientation:  Full (Time, Place, and Person)  Thought Content:  Hallucinations: None  Suicidal Thoughts:  No  Homicidal Thoughts:  No  Memory:  Immediate;   Good Recent;   Good Remote;   Good  Judgement:  Poor  Insight:  Lacking  Psychomotor Activity:  Decreased  Concentration:  NA  Recall:  NA  Fund of Knowledge:Good  Language: Good  Akathisia:  No  Handed:    AIMS (if indicated):     Assets:  Communication Skills Housing Social Support  ADL's:  Intact  Cognition: WNL  Sleep:  Number of Hours: 1.75   Physical examination per ER Constitutional: Asleep; easily awakened for exam. Alert and oriented. Well appearing and in no acute distress. Eyes: Conjunctivae are normal. PERRL. EOMI. Head: Atraumatic. Nose: No congestion/rhinnorhea. Mouth/Throat: Mucous membranes are moist. Oropharynx non-erythematous. Neck: No stridor.  Cardiovascular: Normal rate, regular rhythm. Grossly normal heart sounds. Good peripheral circulation. Respiratory: Normal respiratory effort. No retractions. Lungs CTAB. Gastrointestinal: Soft and nontender. No distention. No abdominal bruits. No CVA tenderness. Musculoskeletal: No lower extremity tenderness nor edema. No joint effusions. Neurologic: Normal speech and language. No gross focal neurologic deficits are appreciated. No gait instability. Skin: Skin is warm, dry and intact. No rash noted. Psychiatric: Mood and affect are  flat. Speech and behavior are normal.   Allergies:  No Known Allergies   Lab Results:  Results for orders placed or performed during the hospital encounter of 02/15/15 (from the past 48 hour(s))  Comprehensive metabolic panel     Status: Abnormal   Collection Time: 02/15/15 10:12 PM  Result Value Ref Range   Sodium 143 135 - 145 mmol/L   Potassium 3.5 3.5 - 5.1 mmol/L   Chloride 109 101 - 111 mmol/L   CO2 24 22 - 32 mmol/L   Glucose, Bld 102 (H) 65 - 99 mg/dL   BUN 16 6 - 20 mg/dL   Creatinine, Ser 1.39 (H) 0.61 - 1.24 mg/dL   Calcium 9.4 8.9 - 10.3 mg/dL   Total Protein 7.3 6.5 - 8.1 g/dL   Albumin 4.1 3.5 - 5.0 g/dL   AST 29 15 - 41 U/L   ALT 23 17 - 63 U/L   Alkaline Phosphatase 88 38 - 126 U/L   Total Bilirubin 0.8 0.3 - 1.2 mg/dL   GFR calc non Af Amer 58 (L) >60 mL/min   GFR calc Af Amer >60 >60 mL/min    Comment: (NOTE) The eGFR has been calculated using the CKD EPI equation. This calculation has not been validated in all clinical situations. eGFR's persistently <60 mL/min signify possible Chronic Kidney Disease.    Anion gap 10 5 - 15  Ethanol (ETOH)     Status: Abnormal   Collection Time: 02/15/15 10:12 PM  Result Value Ref Range   Alcohol, Ethyl (B) 233 (H) <5 mg/dL    Comment:        LOWEST DETECTABLE LIMIT FOR SERUM ALCOHOL IS 5 mg/dL FOR MEDICAL PURPOSES ONLY   Salicylate level     Status: None   Collection Time: 02/15/15 10:12 PM  Result Value Ref Range   Salicylate Lvl <8.8 2.8 - 30.0 mg/dL  Acetaminophen level     Status: Abnormal   Collection Time: 02/15/15 10:12 PM  Result Value Ref Range   Acetaminophen (Tylenol), Serum <10 (L) 10 - 30 ug/mL    Comment:        THERAPEUTIC CONCENTRATIONS VARY SIGNIFICANTLY. A RANGE OF 10-30 ug/mL MAY BE AN EFFECTIVE CONCENTRATION FOR MANY PATIENTS. HOWEVER, SOME ARE BEST TREATED AT CONCENTRATIONS OUTSIDE THIS RANGE. ACETAMINOPHEN CONCENTRATIONS >150 ug/mL AT 4 HOURS AFTER INGESTION AND >50 ug/mL AT  12 HOURS AFTER INGESTION ARE OFTEN ASSOCIATED WITH TOXIC REACTIONS.   CBC     Status: Abnormal   Collection Time: 02/15/15 10:12 PM  Result Value Ref Range   WBC 11.3 (H) 3.8 - 10.6 K/uL   RBC 5.38 4.40 - 5.90 MIL/uL   Hemoglobin 14.9 13.0 - 18.0 g/dL  HCT 46.0 40.0 - 52.0 %   MCV 85.4 80.0 - 100.0 fL   MCH 27.8 26.0 - 34.0 pg   MCHC 32.5 32.0 - 36.0 g/dL   RDW 16.4 (H) 11.5 - 14.5 %   Platelets 194 150 - 440 K/uL  Lipase, blood     Status: Abnormal   Collection Time: 02/15/15 10:12 PM  Result Value Ref Range   Lipase 19 (L) 22 - 51 U/L  Urine Drug Screen, Qualitative ([facility] only)     Status: Abnormal   Collection Time: 02/15/15 10:14 PM  Result Value Ref Range   Tricyclic, Ur Screen NONE DETECTED NONE DETECTED   Amphetamines, Ur Screen NONE DETECTED NONE DETECTED   MDMA (Ecstasy)Ur Screen NONE DETECTED NONE DETECTED   Cocaine Metabolite,Ur [place] POSITIVE (A) NONE DETECTED   Opiate, Ur Screen NONE DETECTED NONE DETECTED   Phencyclidine (PCP) Ur S NONE DETECTED NONE DETECTED   Cannabinoid 50 Ng, Ur [place] POSITIVE (A) NONE DETECTED   Barbiturates, Ur Screen NONE DETECTED NONE DETECTED   Benzodiazepine, Ur Scrn NONE DETECTED NONE DETECTED   Methadone Scn, Ur NONE DETECTED NONE DETECTED    Comment: (NOTE) 557  Tricyclics, urine               Cutoff 1000 ng/mL 200  Amphetamines, urine             Cutoff 1000 ng/mL 300  MDMA (Ecstasy), urine           Cutoff 500 ng/mL 400  Cocaine Metabolite, urine       Cutoff 300 ng/mL 500  Opiate, urine                   Cutoff 300 ng/mL 600  Phencyclidine (PCP), urine      Cutoff 25 ng/mL 700  Cannabinoid, urine              Cutoff 50 ng/mL 800  Barbiturates, urine             Cutoff 200 ng/mL 900  Benzodiazepine, urine           Cutoff 200 ng/mL 1000 Methadone, urine                Cutoff 300 ng/mL 1100 1200 The urine drug screen provides only a preliminary, unconfirmed 1300 analytical test result and should not be used for non-medical 1400  purposes. Clinical consideration and professional judgment should 1500 be applied to any positive drug screen result due to possible 1600 interfering substances. A more specific alternate chemical method 1700 must be used in order to obtain a confirmed analytical result.  1800 Gas chromato graphy / mass spectrometry (GC/MS) is the preferred 1900 confirmatory method.    Current Medications: Current Facility-Administered Medications  Medication Dose Route Frequency Provider Last Rate Last Dose  . acetaminophen (TYLENOL) tablet 650 mg  650 mg Oral Q6H PRN Hildred Priest, MD   650 mg at 02/17/15 0234  . alum & mag hydroxide-simeth (MAALOX/MYLANTA) 200-200-20 MG/5ML suspension 30 mL  30 mL Oral Q4H PRN Hildred Priest, MD      . chlordiazePOXIDE (LIBRIUM) capsule 50 mg  50 mg Oral TID Hildred Priest, MD   50 mg at 02/17/15 0934  . hydrochlorothiazide (HYDRODIURIL) tablet 25 mg  25 mg Oral Daily Hildred Priest, MD      . LORazepam (ATIVAN) tablet 2 mg  2 mg Oral TID PRN Hildred Priest, MD      . magnesium hydroxide (MILK OF MAGNESIA)  suspension 30 mL  30 mL Oral Daily PRN Hildred Priest, MD      . nicotine (NICODERM CQ - dosed in mg/24 hours) patch 21 mg  21 mg Transdermal Q0600 Hildred Priest, MD   21 mg at 02/17/15 0934  . traZODone (DESYREL) tablet 100 mg  100 mg Oral QHS PRN Hildred Priest, MD   100 mg at 02/17/15 0109   PTA Medications: No prescriptions prior to admission     Results for orders placed or performed during the hospital encounter of 02/15/15 (from the past 72 hour(s))  Comprehensive metabolic panel     Status: Abnormal   Collection Time: 02/15/15 10:12 PM  Result Value Ref Range   Sodium 143 135 - 145 mmol/L   Potassium 3.5 3.5 - 5.1 mmol/L   Chloride 109 101 - 111 mmol/L   CO2 24 22 - 32 mmol/L   Glucose, Bld 102 (H) 65 - 99 mg/dL   BUN 16 6 - 20 mg/dL   Creatinine, Ser 1.39 (H)  0.61 - 1.24 mg/dL   Calcium 9.4 8.9 - 10.3 mg/dL   Total Protein 7.3 6.5 - 8.1 g/dL   Albumin 4.1 3.5 - 5.0 g/dL   AST 29 15 - 41 U/L   ALT 23 17 - 63 U/L   Alkaline Phosphatase 88 38 - 126 U/L   Total Bilirubin 0.8 0.3 - 1.2 mg/dL   GFR calc non Af Amer 58 (L) >60 mL/min   GFR calc Af Amer >60 >60 mL/min    Comment: (NOTE) The eGFR has been calculated using the CKD EPI equation. This calculation has not been validated in all clinical situations. eGFR's persistently <60 mL/min signify possible Chronic Kidney Disease.    Anion gap 10 5 - 15  Ethanol (ETOH)     Status: Abnormal   Collection Time: 02/15/15 10:12 PM  Result Value Ref Range   Alcohol, Ethyl (B) 233 (H) <5 mg/dL    Comment:        LOWEST DETECTABLE LIMIT FOR SERUM ALCOHOL IS 5 mg/dL FOR MEDICAL PURPOSES ONLY   Salicylate level     Status: None   Collection Time: 02/15/15 10:12 PM  Result Value Ref Range   Salicylate Lvl <3.2 2.8 - 30.0 mg/dL  Acetaminophen level     Status: Abnormal   Collection Time: 02/15/15 10:12 PM  Result Value Ref Range   Acetaminophen (Tylenol), Serum <10 (L) 10 - 30 ug/mL    Comment:        THERAPEUTIC CONCENTRATIONS VARY SIGNIFICANTLY. A RANGE OF 10-30 ug/mL MAY BE AN EFFECTIVE CONCENTRATION FOR MANY PATIENTS. HOWEVER, SOME ARE BEST TREATED AT CONCENTRATIONS OUTSIDE THIS RANGE. ACETAMINOPHEN CONCENTRATIONS >150 ug/mL AT 4 HOURS AFTER INGESTION AND >50 ug/mL AT 12 HOURS AFTER INGESTION ARE OFTEN ASSOCIATED WITH TOXIC REACTIONS.   CBC     Status: Abnormal   Collection Time: 02/15/15 10:12 PM  Result Value Ref Range   WBC 11.3 (H) 3.8 - 10.6 K/uL   RBC 5.38 4.40 - 5.90 MIL/uL   Hemoglobin 14.9 13.0 - 18.0 g/dL   HCT 46.0 40.0 - 52.0 %   MCV 85.4 80.0 - 100.0 fL   MCH 27.8 26.0 - 34.0 pg   MCHC 32.5 32.0 - 36.0 g/dL   RDW 16.4 (H) 11.5 - 14.5 %   Platelets 194 150 - 440 K/uL  Lipase, blood     Status: Abnormal   Collection Time: 02/15/15 10:12 PM  Result Value Ref Range    Lipase  19 (L) 22 - 51 U/L  Urine Drug Screen, Qualitative ([facility] only)     Status: Abnormal   Collection Time: 02/15/15 10:14 PM  Result Value Ref Range   Tricyclic, Ur Screen NONE DETECTED NONE DETECTED   Amphetamines, Ur Screen NONE DETECTED NONE DETECTED   MDMA (Ecstasy)Ur Screen NONE DETECTED NONE DETECTED   Cocaine Metabolite,Ur [place] POSITIVE (A) NONE DETECTED   Opiate, Ur Screen NONE DETECTED NONE DETECTED   Phencyclidine (PCP) Ur S NONE DETECTED NONE DETECTED   Cannabinoid 50 Ng, Ur [place] POSITIVE (A) NONE DETECTED   Barbiturates, Ur Screen NONE DETECTED NONE DETECTED   Benzodiazepine, Ur Scrn NONE DETECTED NONE DETECTED   Methadone Scn, Ur NONE DETECTED NONE DETECTED    Comment: (NOTE) 350  Tricyclics, urine               Cutoff 1000 ng/mL 200  Amphetamines, urine             Cutoff 1000 ng/mL 300  MDMA (Ecstasy), urine           Cutoff 500 ng/mL 400  Cocaine Metabolite, urine       Cutoff 300 ng/mL 500  Opiate, urine                   Cutoff 300 ng/mL 600  Phencyclidine (PCP), urine      Cutoff 25 ng/mL 700  Cannabinoid, urine              Cutoff 50 ng/mL 800  Barbiturates, urine             Cutoff 200 ng/mL 900  Benzodiazepine, urine           Cutoff 200 ng/mL 1000 Methadone, urine                Cutoff 300 ng/mL 1100 1200 The urine drug screen provides only a preliminary, unconfirmed 1300 analytical test result and should not be used for non-medical 1400 purposes. Clinical consideration and professional judgment should 1500 be applied to any positive drug screen result due to possible 1600 interfering substances. A more specific alternate chemical method 1700 must be used in order to obtain a confirmed analytical result.  1800 Gas chromato graphy / mass spectrometry (GC/MS) is the preferred 1900 confirmatory method.       Treatment Plan Summary: Daily contact with patient to assess and evaluate symptoms and progress in treatment and Medication management   Major  depressive disorder and posttraumatic stress disorder: Patient is currently not receiving any psychiatric treatment. He used to see a psychiatrist at [facility] but has not been seen there in several months. Will start patient on Zoloft 25 mg by mouth daily.  Insomnia: Continue trazodone 100 mg by mouth daily at bedtime.  Alcohol withdrawal: CIWA will be ordered every 8 hours. Vital signs will be check every 8 hours. Patient is currently on leaving and taper he has been started on 50 mg 3 times a day. Patient is using Ativan when necessary.  Alcohol, cannabis and cocaine dependence: Patient is states he really does not have interest in staying sober.  He is open to a referral for treatment.  Tobacco use disorder continue nicotine patch 21 mg  Hypertension patient has been restarted on home dose of hydrochlorothiazide 25 mg a day  Chronic pancreatitis-pain: I will order ibuprofen when necessary for patient.  Precautions every 15 minute checks and withdrawal  Diet low sodium  Discharge planning: Once a stable patient will be  discharged back to his family and he'll be referred for intensive outpatient substance abuse treatment.    Medical Decision Making:  Established Problem, Stable/Improving (1)  I certify that inpatient services furnished can reasonably be expected to improve the patient's condition.   Hildred Priest 9/5/201612:45 PM

## 2015-02-17 NOTE — Tx Team (Signed)
Initial Interdisciplinary Treatment Plan   PATIENT STRESSORS: Medication change or noncompliance Substance abuse   PATIENT STRENGTHS: Average or above average intelligence Capable of independent living Physical Health Supportive family/friends   PROBLEM LIST: Problem List/Patient Goals Date to be addressed Date deferred Reason deferred Estimated date of resolution  Depression 02/17/2015   02/24/2015  Suicidality 02/17/2015   02/24/2015  Medication Non-Complaince 02/17/2015   02/24/2015  Substance Abuse 02/17/2015   02/24/2015                                 DISCHARGE CRITERIA:  Improved stabilization in mood, thinking, and/or behavior Need for constant or close observation no longer present Reduction of life-threatening or endangering symptoms to within safe limits Verbal commitment to aftercare and medication compliance Withdrawal symptoms are absent or subacute and managed without 24-hour nursing intervention  PRELIMINARY DISCHARGE PLAN: Attend 12-step recovery group Outpatient therapy Return to previous living arrangement  PATIENT/FAMIILY INVOLVEMENT: This treatment plan has been presented to and reviewed with the patient, CURRIE DENNIN, and/or family member.  The patient and family have been given the opportunity to ask questions and make suggestions.  Zamani Crocker Shari Prows 02/17/2015, 3:10 AM

## 2015-02-17 NOTE — Progress Notes (Addendum)
D: Pt is awake and active in the milieu this evening. Pt mood is depressed and his affect is sad. Pt endorses passive SI and AH, but contracts for safety. Pt is isolating to his room this evening. Pt forwards but is pleasant and cooperative with staff. Pt CIWA is 6.  A: Writer provided emotional support and administered medications as prescribed.   R: Pt is not participating in unit activities, but reports feeling better than yesterday.

## 2015-02-18 MED ORDER — PROMETHAZINE HCL 25 MG PO TABS: 25.0000 mg | ORAL_TABLET | Freq: Three times a day (TID) | ORAL | Status: DC | PRN

## 2015-02-18 MED ORDER — TRAZODONE HCL 50 MG PO TABS: 150.0000 mg | ORAL_TABLET | Freq: Every evening | ORAL | Status: DC | PRN

## 2015-02-18 MED ORDER — KETOROLAC TROMETHAMINE 10 MG PO TABS
10.0000 mg | ORAL_TABLET | Freq: Four times a day (QID) | ORAL | Status: AC | PRN
  Administered 2015-02-18: 10 mg via ORAL
  Filled 2015-02-18 (×2): qty 1

## 2015-02-18 MED ORDER — PANCRELIPASE (LIP-PROT-AMYL) 12000-38000 UNITS PO CPEP
36000.0000 [IU] | ORAL_CAPSULE | Freq: Three times a day (TID) | ORAL | Status: DC
  Administered 2015-02-18 – 2015-02-25 (×20): 36000 [IU] via ORAL
  Filled 2015-02-18 (×25): qty 3

## 2015-02-18 MED ORDER — PANTOPRAZOLE SODIUM 40 MG PO TBEC
40.0000 mg | DELAYED_RELEASE_TABLET | Freq: Every day | ORAL | Status: DC
  Administered 2015-02-18 – 2015-02-25 (×8): 40 mg via ORAL
  Filled 2015-02-18 (×8): qty 1

## 2015-02-18 MED ORDER — PNEUMOCOCCAL VAC POLYVALENT 25 MCG/0.5ML IJ INJ
0.5000 mL | INJECTION | INTRAMUSCULAR | Status: AC
  Administered 2015-02-19: 0.5 mL via INTRAMUSCULAR
  Filled 2015-02-18: qty 0.5

## 2015-02-18 NOTE — Progress Notes (Signed)
Initial Nutrition Assessment    INTERVENTION:   Meals and Snacks: agree with current diet order (low sodium, low fat/low cholesterol); pt with chronic pancreatitis, HTN. Most important dietary change for pt would be abstaining from alcohol with hx of chronic pancreatitis; noted per MD note, pt stating no interest in staying sober. Pt should also limit fat (although some dietary fat is necessary and it is suggested that pt's consume moderate amounts of fat or as much as they can tolerate). Noted lipase wdl at present, on creon with meals. Some patients with chronic pancreatitis have chronic pain regardless of modifications in diet/lifestyle changes; again most important dietary change for this pt long term will be avoidance of all alcohol.   Education: pt with auditory hallucinations, not appropriate for meaningful diet education at this time; will follow-up and provide education with more appropriate   NUTRITION DIAGNOSIS:   Reassess on follow-up  GOAL:   Patient will meet greater than or equal to 90% of their needs  MONITOR:    (Energy Intake, Anthropometrics, Digestive System)  REASON FOR ASSESSMENT:   Consult  (pt with chronic pancreatitis and severe pain, please provide diet recommendations)  ASSESSMENT:    Pt admitted with alcohol-induced depressive disorder; pt with chronic pancreatitis. Per MD note, pt drinks fifth of liquor as well as 12 pack of beer daily. Pt continues to report suicidal ideation and auditory hallucinations at present  Past Medical History  Diagnosis Date  . Pancreatitis   . Hypertension   . PTSD (post-traumatic stress disorder)   . Bipolar 1 disorder      Diet Order:  Diet Heart Room service appropriate?: Yes; Fluid consistency:: Thin   Energy Intake: recorded po intake 90-100% of meals, reports appetite poor  Digestive System: +abdominal pain, nausea  Recent Labs Lab 02/15/15 2212  NA 143  K 3.5  CL 109  CO2 24  BUN 16  CREATININE  1.39*  CALCIUM 9.4  GLUCOSE 102*    Gastrointestinal profile Lipase     Component Value Date/Time   LIPASE 19* 02/15/2015 2212   Meds: creon, phenergan, toradol  Height:   Ht Readings from Last 1 Encounters:  02/17/15  (1.778 m)    Weight:   Wt Readings from Last 1 Encounters:  02/17/15 173 lb (78.472 kg)    BMI:  Body mass index is 24.82 kg/(m^2).  LOW Care Level  Romelle Starcher MS, [place], LDN [phone] Pager

## 2015-02-18 NOTE — Progress Notes (Signed)
Recreation Therapy Notes  Date: 09.06.16 Time: 3:00 pm Location: Craft Room  Group Topic: Self-expression  Goal Area(s) Addresses:  Patient will identify one color per emotion listed on wheel. Patient will verbalize benefit of using art as a means of self-expression. Patient will verbalize one emotion experienced during session. Patient will be educated on other forms of self-expression.  Behavioral Response: Did not attend  Intervention: Emotion Wheel  Activity: Patients were given a worksheet with 7 different emotions and were instructed to pick a color for each emotion.   Education: LRT educated patients on different forms of self-expression.  Education Outcome: Patient did not attend group.  Clinical Observations/Feedback: Patient did not attend group.  Bazil Dhanani M, LRT/CTRS 02/18/2015 4:58 PM 

## 2015-02-18 NOTE — Tx Team (Signed)
Interdisciplinary Treatment Plan Update (Adult)  Date:  02/18/2015 Time Reviewed:  4:58 PM  Progress in Treatment: Attending groups: No. Participating in groups:  No. Taking medication as prescribed:  Yes. Tolerating medication:  Yes. Family/Significant othe contact made:  No, will contact:  Pt not agreeable to family contact Patient understands diagnosis:  No. Discussing patient identified problems/goals with staff:  Yes. Medical problems stabilized or resolved:  Yes. Denies suicidal/homicidal ideation: Yes. Issues/concerns per patient self-inventory:  No. Other:  New problem(s) identified: No, Describe:     Discharge Plan or Barriers: Requesting referral to [facility] or other treatment program  Reason for Continuation of Hospitalization: Depression Withdrawal symptoms  Comments:Walter [name] is a 49 y.o. male patient admitted with a h/o polysubstance use (alcohol, crack cocaine and marijuana) who presented to the [facility] ED for detoxification from the aforementioned substances. Per ED physicians notes, the pt's last alcohol use was 30 minutes prior to arrival. Admission ethanol level on 02-15-15 at 10:57pm was 233. He drinks half a fifth of liquor along a 12 pack of beer daily. He reports a h/o bipolar d/o and PTSD and has been off of his medications for about 1 month. Reports command AH telling him to harm himself starting 3 days ago.   Estimated length of stay: up to 3 days  New goal(s):  Review of initial/current patient goals per problem list:   See plan of Care  Attendees: Patient:  Walter [name] 9/6/20164:58 PM  Family:   9/6/20164:58 PM  Physician:  Dr. Ardyth Harps 9/6/20164:58 PM  Nursing:    9/6/20164:58 PM  Case Manager:   9/6/20164:58 PM  Counselor:   9/6/20164:58 PM  Other:  Jake Shark, LCSW 9/6/20164:58 PM  Other:  Beryl Meager, LCWSA 9/6/20164:58 PM  Other:  Hershal Coria, LRT 9/6/20164:58 PM  Other:  9/6/20164:58 PM  Other:  9/6/20164:58 PM  Other:   9/6/20164:58 PM  Other:  9/6/20164:58 PM  Other:  9/6/20164:58 PM  Other:  9/6/20164:58 PM  Other:   9/6/20164:58 PM   Scribe for Treatment Team:   Glennon Mac, 02/18/2015, 4:58 PM, MSW, LCSW

## 2015-02-18 NOTE — Plan of Care (Signed)
Problem: Alteration in mood & ability to function due to Goal: STG-Patient will report withdrawal symptoms Outcome: Progressing Pt informs staff of withdrawal symptoms during assessment

## 2015-02-18 NOTE — Progress Notes (Signed)
Recreation Therapy Notes  INPATIENT RECREATION THERAPY ASSESSMENT  Patient Details Name: Walter [name] MRN: [id number] DOB: 10/07/1965 Today's Date: 02/18/2015  Patient Stressors: Family, Friends, Other (Comment) (Lack of friends; living situation)  Coping Skills:   Isolate, Arguments, Substance Abuse, Avoidance, Music, Sports  Personal Challenges: Anger, Communication, Concentration, Decision-Making, Problem-Solving, Relationships, Self-Esteem/Confidence, Social Interaction, Stress Management, Substance Abuse, Trusting Others  Leisure Interests (2+):  Individual - TV  Awareness of Community Resources:  No  Community Resources:     Current Use:    If no, Barriers?:    Patient Strengths:  "I love living and I love being myself"  Patient Identified Areas of Improvement:  Drinking, smoking, anger management  Current Recreation Participation:  Watching TV  Patient Goal for Hospitalization:  To get better and stop hearing voices and for stomach to stop hurting  [place] of Residence:  [place] of Residence:  [place]   Current SI (including self-harm):   ("Sometimes")  Current HI:  No  Consent to Intern Participation: N/A   Jacquelynn Cree, LRT/CTRS 02/18/2015, 2:34 PM

## 2015-02-18 NOTE — Progress Notes (Signed)
[facility] MD Progress Note  02/18/2015 3:04 PM Walter [name]  MRN:  [id number] Subjective:  Patient was seen today. His appetite is apparently attending groups he was found lying in bed with the lights off [address] with blankets late in the afternoon. He continues to report suicidal ideation and is states he continues to have auditory hallucinations. He has been hearing voices that tell him to go and drink. The patient states he continues to have abdominal pain, and nausea due to his chronic pancreatitis. He grades his pain as a 10 out of 10 (during assessment he does not appear to be in significant distress). The patient stated he had trouble sleeping last night.  He also said his appetite is poor. Patient denies side effects from medications. Patient denies having any other physical complaints. Patient reports having some interest in residential treatment after discharge.  Per nursing: Pt appears to be flat and guarded throughout the day. Describes energy level as low. Pt rates depression as a 9 out of 10, hopelessness an 8 out of 10, and anxiety a 10 out of 10. Pt displays symptoms of withdrawal including mild tremors, mild H/A, sensitivity to light and sounds, night sweats, anxiety, and agitation. Pt is focused on abdominal pain, which he rates as a 9 out of 10 unrelieved by medication. Pt verbalizes intermittent SI. He has given a Engineer, manufacturing for safety. Pt is on q15 minute checks, is given medications for support, and is also given emotional support.  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 Tobacco use disorder [Z72.0] 02/17/2015  . Pancreatitis, chronic [K86.1] 02/17/2015  . HTN (hypertension) [I10] 02/17/2015  . Alcohol withdrawal [F10.239] 02/17/2015  . 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 PTSD (post-traumatic stress disorder)  [F43.10] 02/17/2015  . Major depressive disorder, recurrent episode, moderate [F33.1] 02/17/2015  . Alcohol use disorder, severe, dependence [F10.20] 02/16/2015  . Cocaine use disorder, moderate, dependence [F14.20] 02/16/2015   Total Time spent with patient: 30 minutes   Past Medical History:  Past Medical History  Diagnosis Date  . Pancreatitis   . Hypertension   . PTSD (post-traumatic stress disorder)   . Bipolar 1 disorder     Past Surgical History  Procedure Laterality Date  . Cholecystectomy    . Pancreas surgery     Family History: History reviewed. No pertinent family history. Social History:  History  Alcohol Use  . 7.2 oz/week  . 12 Cans of beer per week    Comment: 1/2 of a 5th a day     History  Drug Use  . Yes  . Special: Cocaine, Marijuana    Comment: uses daily    Social History   Social History  . Marital Status: Widowed    Spouse Name: N/A  . Number of Children: N/A  . Years of Education: N/A   Social History Main Topics  . Smoking status: Current Every Day Smoker -- 0.50 packs/day for 20 years    Types: Cigarettes  . Smokeless tobacco: None  . Alcohol Use: 7.2 oz/week    12 Cans of beer per week     Comment: 1/2 of a 5th a day  . Drug Use: Yes    Special: Cocaine, Marijuana     Comment: uses daily  . Sexual Activity: Yes    Birth Control/ Protection: None   Other Topics Concern  . None  Social History Narrative   Additional History:    Sleep: Poor  Appetite:  Fair   Assessment:   Musculoskeletal: Strength & Muscle Tone: within normal limits Gait & Station: normal Patient leans: N/A   Psychiatric Specialty Exam: Physical Exam  Review of Systems  Constitutional: Negative.   HENT: Negative.   Eyes: Negative.   Respiratory: Negative.   Cardiovascular: Negative.   Gastrointestinal: Positive for nausea and abdominal pain.  Genitourinary: Negative.   Musculoskeletal: Negative.   Skin: Negative.   Neurological:  Negative.   Endo/Heme/Allergies: Negative.   Psychiatric/Behavioral: Positive for depression, suicidal ideas, hallucinations and substance abuse.    Blood pressure 111/89, pulse 85, temperature 98.2 F (36.8 C), temperature source Oral, resp. rate 20, height 5\' 10"  (1.778 m), weight 78.472 kg (173 lb), SpO2 100 %.Body mass index is 24.82 kg/(m^2).  General Appearance: Fairly Groomed  Patent attorney::  Poor  Speech:  Normal Rate  Volume:  Decreased  Mood:  Dysphoric  Affect:  Blunt  Thought Process:  Linear and Logical  Orientation:  Full (Time, Place, and Person)  Thought Content:  Hallucinations: Auditory  Suicidal Thoughts:  Yes.  without intent/plan  Homicidal Thoughts:  No  Memory:  Immediate;   Fair Recent;   Fair Remote;   Fair  Judgement:  Poor  Insight:  Shallow  Psychomotor Activity:  Decreased  Concentration:  NA  Recall:  NA  Fund of Knowledge:Fair  Language: Good  Akathisia:  No  Handed:    AIMS (if indicated):     Assets:  Architect Housing  ADL's:  Intact  Cognition: WNL  Sleep:  Number of Hours: 4.5     Current Medications: Current Facility-Administered Medications  Medication Dose Route Frequency Provider Last Rate Last Dose  . acetaminophen (TYLENOL) tablet 650 mg  650 mg Oral Q6H PRN Jimmy Footman, MD   650 mg at 02/18/15 1250  . alum & mag hydroxide-simeth (MAALOX/MYLANTA) 200-200-20 MG/5ML suspension 30 mL  30 mL Oral Q4H PRN Jimmy Footman, MD      . chlordiazePOXIDE (LIBRIUM) capsule 50 mg  50 mg Oral TID Jimmy Footman, MD   50 mg at 02/18/15 0925  . hydrochlorothiazide (HYDRODIURIL) tablet 25 mg  25 mg Oral Daily Jimmy Footman, MD   25 mg at 02/18/15 0925  . ketorolac (TORADOL) tablet 10 mg  10 mg Oral Q6H PRN Jimmy Footman, MD      . lipase/protease/amylase (CREON) capsule 36,000 Units  36,000 Units Oral TID WC Jimmy Footman, MD      .  LORazepam (ATIVAN) tablet 2 mg  2 mg Oral TID PRN Jimmy Footman, MD      . magnesium hydroxide (MILK OF MAGNESIA) suspension 30 mL  30 mL Oral Daily PRN Jimmy Footman, MD      . nicotine (NICODERM CQ - dosed in mg/24 hours) patch 21 mg  21 mg Transdermal Q0600 Jimmy Footman, MD   21 mg at 02/18/15 0930  . pantoprazole (PROTONIX) EC tablet 40 mg  40 mg Oral Daily Jimmy Footman, MD      . Melene Muller ON 02/19/2015] pneumococcal 23 valent vaccine (PNU-IMMUNE) injection 0.5 mL  0.5 mL Intramuscular Tomorrow-1000 Jimmy Footman, MD      . promethazine (PHENERGAN) tablet 25 mg  25 mg Oral Q8H PRN Jimmy Footman, MD      . sertraline (ZOLOFT) tablet 25 mg  25 mg Oral Daily Jimmy Footman, MD   25 mg at 02/18/15 0925  . traZODone (DESYREL) tablet  150 mg  150 mg Oral QHS PRN Jimmy Footman, MD        Lab Results: No results found for this or any previous visit (from the past 48 hour(s)).  Physical Findings: AIMS: Facial and Oral Movements Muscles of Facial Expression: None, normal Lips and Perioral Area: None, normal Jaw: None, normal Tongue: None, normal,Extremity Movements Upper (arms, wrists, hands, fingers): None, normal Lower (legs, knees, ankles, toes): None, normal, Trunk Movements Neck, shoulders, hips: None, normal, Overall Severity Severity of abnormal movements (highest score from questions above): None, normal Incapacitation due to abnormal movements: None, normal Patient's awareness of abnormal movements (rate only patient's report): No Awareness, Dental Status Current problems with teeth and/or dentures?: No Does patient usually wear dentures?: No  CIWA:  CIWA-Ar Total: 8 COWS:     Treatment Plan Summary: Daily contact with patient to assess and evaluate symptoms and progress in treatment and Medication management   Major depressive disorder and posttraumatic stress disorder: Patient is currently  not receiving any psychiatric treatment. He used to see a psychiatrist at [facility] but has not been seen there in several months. Continue Zoloft 25 mg by mouth daily.  Insomnia: will increase trazodone to 150 mg po qhs.  Alcohol withdrawal: CIWA will be ordered every 8 hours. Vital signs will be check every 8 hours. Patient is currently on leaving and taper he has been started on 50 mg 3 times a day. Patient is using Ativan when necessary. CIWA has been between 8-10  Alcohol, cannabis and cocaine dependence: Patient is states he really does not have interest in staying sober. He is open to a referral for treatment.  Tobacco use disorder continue nicotine patch 21 mg  Hypertension patient has been restarted on home dose of hydrochlorothiazide 25 mg a day  Chronic pancreatitis-pain: still c/o pain.  Toradol prn will be ordered.  Will also order pancelipase tid.  Nausea: will continue phenergan prn  Precautions every 15 minute checks and withdrawal  Diet low sodium.  Will consult dietician   Discharge planning: Once a stable patient will be discharged to a residential treatment facility.  Medical Decision Making:  Established Problem, Stable/Improving (1)     Jimmy Footman 02/18/2015, 3:04 PM

## 2015-02-18 NOTE — [facility] Group Notes (Signed)
[facility] Group Notes:  (Nursing/MHT/Case Management/Adjunct)  Date:  02/18/2015  Time:  3:43 PM  Type of Therapy:  Psychoeducational Skills  Participation Level:  Did Not Attend   Walter [name] 02/18/2015, 3:43 PM

## 2015-02-18 NOTE — [facility] Group Notes (Signed)
[facility] Group Notes:  (Nursing/MHT/Case Management/Adjunct)  Date:  02/18/2015  Time:  9:41 PM  Type of Therapy:  Psychoeducational Skills  Participation Level:  Active  Participation Quality:  Appropriate, Attentive and Sharing  Affect:  Appropriate  Cognitive:  Appropriate and Oriented  Insight:  Good  Engagement in Group:  Engaged, Improving and Supportive  Modes of Intervention:  Discussion and Exploration  Summary of Progress/Problems:  Walter [name] 02/18/2015, 9:41 PM

## 2015-02-18 NOTE — Progress Notes (Signed)
Pt appears to be flat and guarded throughout the day. Describes energy level as low. Pt rates depression as a 9 out of 10, hopelessness an 8 out of 10, and anxiety a 10 out of 10. Pt displays symptoms of withdrawal including mild tremors, mild H/A, sensitivity to light and sounds, night sweats, anxiety, and agitation. Pt is focused on abdominal pain, which he rates as a 9 out of 10 unrelieved by medication. Pt verbalizes intermittent SI. He has given a Engineer, manufacturing for safety. Pt is on q15 minute checks, is given medications for support, and is also given emotional support.

## 2015-02-19 DIAGNOSIS — F322 Major depressive disorder, single episode, severe without psychotic features: Secondary | ICD-10-CM

## 2015-02-19 DIAGNOSIS — F332 Major depressive disorder, recurrent severe without psychotic features: Secondary | ICD-10-CM | POA: Diagnosis not present

## 2015-02-19 MED ORDER — CHLORDIAZEPOXIDE HCL 25 MG PO CAPS
25.0000 mg | ORAL_CAPSULE | Freq: Four times a day (QID) | ORAL | Status: DC
  Administered 2015-02-19 – 2015-02-20 (×4): 25 mg via ORAL
  Filled 2015-02-19 (×4): qty 1

## 2015-02-19 NOTE — [facility] Group Notes (Signed)
[facility] Group Notes:  (Nursing/MHT/Case Management/Adjunct)  Date:  02/19/2015  Time:  1:46 PM  Type of Therapy:  Psychoeducational Skills  Participation Level:  Did Not Attend   Walter [name] 02/19/2015, 1:46 PM 

## 2015-02-19 NOTE — Progress Notes (Addendum)
[facility] MD Progress Note  02/19/2015 2:27 PM Walter [name]  MRN:  [id number] Subjective:  Patient was seen today. Patient continues to isolates to his room. He was seen sleeping late in the morning. Patient has minimal participation in groups. His eye contact is poor. His mood appears depressed and affect is blunted.  Patient continues to report depressed mood, suicidal thoughts without a plan, and auditory hallucinations, hearing a voice of a male that is no history that tell him to go and drink.  Patient also continues to report abdominal pain secondary to his chronic pancreatitis. The patient denies nausea and vomiting or diarrhea. Describes his appetite is limited, his energy is poor his concentration is limited.  Patient denies side effects from medications. Denies other physical complaints. Per nursing staff notes patient slept 8 hours last night.  Per nursing: Patient with depressed affect and cooperative behavior with meals, meds and plan of care. No SI/HI/AVH at this time. Patient remains on detox protocol and reports feeling tired. Verbalizes needs appropriately with staff. Attends therapy group to learn and initiate coping skills for management of stressors and diagnosis. Reports today is his birthday and dietary notified. Good adls. Fair eye contact, quiet speech. Safety maintained.   Principal Problem: Severe major depression without psychotic features Diagnosis:   Patient Active Problem List   Diagnosis Date Noted  . Severe major depression without psychotic features [F32.2] 02/19/2015  . Tobacco use disorder [Z72.0] 02/17/2015  . Pancreatitis, chronic [K86.1] 02/17/2015  . HTN (hypertension) [I10] 02/17/2015  . Alcohol withdrawal [F10.239] 02/17/2015  . PTSD (post-traumatic stress disorder) [F43.10] 02/17/2015  . Major depressive disorder, recurrent episode, moderate [F33.1] 02/17/2015  . Alcohol use disorder, severe, dependence [F10.20] 02/16/2015  . Cocaine use disorder, moderate,  dependence [F14.20] 02/16/2015   Total Time spent with patient: 30 minutes   Past Medical History:  Past Medical History  Diagnosis Date  . Pancreatitis   . Hypertension   . PTSD (post-traumatic stress disorder)   . Bipolar 1 disorder     Past Surgical History  Procedure Laterality Date  . Cholecystectomy    . Pancreas surgery     Family History: History reviewed. No pertinent family history. Social History:  History  Alcohol Use  . 7.2 oz/week  . 12 Cans of beer per week    Comment: 1/2 of a 5th a day     History  Drug Use  . Yes  . Special: Cocaine, Marijuana    Comment: uses daily    Social History   Social History  . Marital Status: Widowed    Spouse Name: N/A  . Number of Children: N/A  . Years of Education: N/A   Social History Main Topics  . Smoking status: Current Every Day Smoker -- 0.50 packs/day for 20 years    Types: Cigarettes  . Smokeless tobacco: None  . Alcohol Use: 7.2 oz/week    12 Cans of beer per week     Comment: 1/2 of a 5th a day  . Drug Use: Yes    Special: Cocaine, Marijuana     Comment: uses daily  . Sexual Activity: Yes    Birth Control/ Protection: None   Other Topics Concern  . None   Social History Narrative   Additional History:    Sleep: Good  Appetite:  Fair   Assessment:   Musculoskeletal: Strength & Muscle Tone: within normal limits Gait & Station: normal Patient leans: N/A   Psychiatric Specialty Exam: Physical Exam  Review of Systems  Constitutional: Negative.   HENT: Negative.   Eyes: Negative.   Respiratory: Negative.   Cardiovascular: Negative.   Gastrointestinal: Positive for nausea and abdominal pain.  Genitourinary: Negative.   Musculoskeletal: Negative.   Skin: Negative.   Neurological: Negative.   Endo/Heme/Allergies: Negative.   Psychiatric/Behavioral: Positive for depression, suicidal ideas, hallucinations and substance abuse.    Blood pressure 109/75, pulse 73, temperature 98.6  F (37 C), temperature source Oral, resp. rate 20, height 5\' 10"  (1.778 m), weight 78.472 kg (173 lb), SpO2 100 %.Body mass index is 24.82 kg/(m^2).  General Appearance: Fairly Groomed  Patent attorney::  Poor  Speech:  Normal Rate  Volume:  Decreased  Mood:  Dysphoric  Affect:  Blunt  Thought Process:  Linear and Logical  Orientation:  Full (Time, Place, and Person)  Thought Content:  Hallucinations: Auditory  Suicidal Thoughts:  Yes.  without intent/plan  Homicidal Thoughts:  No  Memory:  Immediate;   Fair Recent;   Fair Remote;   Fair  Judgement:  Poor  Insight:  Shallow  Psychomotor Activity:  Decreased  Concentration:  NA  Recall:  NA  Fund of Knowledge:Fair  Language: Good  Akathisia:  No  Handed:    AIMS (if indicated):     Assets:  Architect Housing  ADL's:  Intact  Cognition: WNL  Sleep:  Number of Hours: 8     Current Medications: Current Facility-Administered Medications  Medication Dose Route Frequency Provider Last Rate Last Dose  . acetaminophen (TYLENOL) tablet 650 mg  650 mg Oral Q6H PRN Jimmy Footman, MD   650 mg at 02/19/15 0855  . alum & mag hydroxide-simeth (MAALOX/MYLANTA) 200-200-20 MG/5ML suspension 30 mL  30 mL Oral Q4H PRN Jimmy Footman, MD      . chlordiazePOXIDE (LIBRIUM) capsule 25 mg  25 mg Oral QID Jimmy Footman, MD   25 mg at 02/19/15 1311  . hydrochlorothiazide (HYDRODIURIL) tablet 25 mg  25 mg Oral Daily Jimmy Footman, MD   25 mg at 02/19/15 1610  . ketorolac (TORADOL) tablet 10 mg  10 mg Oral Q6H PRN Jimmy Footman, MD   10 mg at 02/18/15 1832  . lipase/protease/amylase (CREON) capsule 36,000 Units  36,000 Units Oral TID WC Jimmy Footman, MD   36,000 Units at 02/19/15 1155  . magnesium hydroxide (MILK OF MAGNESIA) suspension 30 mL  30 mL Oral Daily PRN Jimmy Footman, MD      . nicotine (NICODERM CQ - dosed in mg/24  hours) patch 21 mg  21 mg Transdermal Q0600 Jimmy Footman, MD   21 mg at 02/19/15 9604  . pantoprazole (PROTONIX) EC tablet 40 mg  40 mg Oral Daily Jimmy Footman, MD   40 mg at 02/19/15 0853  . promethazine (PHENERGAN) tablet 25 mg  25 mg Oral Q8H PRN Jimmy Footman, MD      . sertraline (ZOLOFT) tablet 25 mg  25 mg Oral Daily Jimmy Footman, MD   25 mg at 02/19/15 0853  . traZODone (DESYREL) tablet 150 mg  150 mg Oral QHS PRN Jimmy Footman, MD        Lab Results: No results found for this or any previous visit (from the past 48 hour(s)).  Physical Findings: AIMS: Facial and Oral Movements Muscles of Facial Expression: None, normal Lips and Perioral Area: None, normal Jaw: None, normal Tongue: None, normal,Extremity Movements Upper (arms, wrists, hands, fingers): None, normal Lower (legs, knees, ankles, toes): None, normal, Trunk Movements Neck, shoulders, hips:  None, normal, Overall Severity Severity of abnormal movements (highest score from questions above): None, normal Incapacitation due to abnormal movements: None, normal Patient's awareness of abnormal movements (rate only patient's report): No Awareness, Dental Status Current problems with teeth and/or dentures?: No Does patient usually wear dentures?: No  CIWA:  CIWA-Ar Total: 4 COWS:     Treatment Plan Summary: Daily contact with patient to assess and evaluate symptoms and progress in treatment and Medication management   Major depressive disorder and posttraumatic stress disorder: Patient is currently not receiving any psychiatric treatment. He used to see a psychiatrist at [place] but has not been seen there in several months. Continue Zoloft 25 mg by mouth daily.  Insomnia: Continue trazodone to 150 mg po qhs.  Alcohol withdrawal: CIWA will be d/c today. Vital signs will be check every 12 h. Patient is currently on librium and taper.  Today dose of Librium will be  decreased to 25 mg 4 times a day he has been started on 50 mg 4 times a day.   Alcohol, cannabis and cocaine dependence: Patient says he wants to get better and get sober for his family. He says he has a son and he wants to live and get better for his son.  Tobacco use disorder continue nicotine patch 21 mg  Hypertension patient has been restarted on home dose of hydrochlorothiazide 25 mg a day  Chronic pancreatitis-pain: still c/o pain.  Continue Toradol when necessary.  Continue pancelipase tid.  Nausea: will continue phenergan prn  Precautions every 15 minute checks and withdrawal  Diet low sodium.  Will consult dietician   Discharge planning: Once a stable patient will be discharged to a residential treatment facility. Referral made to [facility] today and [facility]  Medical Decision Making:  Established Problem, Stable/Improving (1)     Jimmy Footman 02/19/2015, 2:27 PM

## 2015-02-19 NOTE — Progress Notes (Signed)
Visible in the milieu, CIWA=2, VSS, denied any form of Hallucinations, denied S/ISIB/HI.

## 2015-02-19 NOTE — Plan of Care (Signed)
Problem: Alteration in mood & ability to function due to Goal: LTG-Pt reports reduction in suicidal thoughts (Patient reports reduction in suicidal thoughts and is able to verbalize a safety plan for whenever patient is feeling suicidal)  Outcome: Progressing Does not report any SI/HI at this time.

## 2015-02-19 NOTE — Progress Notes (Signed)
Recreation Therapy Notes  Date: 09.07.16 Time: 3:00 pm Location: Craft Room  Group Topic: Self-esteem  Goal Area(s) Addresses:  Patients will write at least one positive trait.  Patients will verbalize benefit of having good self-esteem.  Behavioral Response: Attentive  Intervention: I Am  Activity: Patients were given a worksheet with the letter I on it and instructed to write as many positive traits about themselves inside the letter.  Education:LRT educated patients on ways to increase their self-esteem.   Education Outcome: In group clarification offered  Clinical Observations/Feedback: Patient completed approximately 40% of worksheet. Patient did not contribute to group discussion.  Jacquelynn Cree, LRT/CTRS 02/19/2015 4:33 PM

## 2015-02-19 NOTE — Progress Notes (Signed)
Patient with depressed affect and cooperative behavior with meals, meds and plan of care. No SI/HI/AVH at this time. Patient remains on detox protocol and reports feeling tired. Verbalizes needs appropriately with staff. Attends therapy group to learn and initiate coping skills for management of stressors and diagnosis. Reports today is his birthday and dietary notified. Good adls. Fair eye contact, quiet speech. Safety maintained.

## 2015-02-19 NOTE — Plan of Care (Signed)
Problem: [facility] Participation in Recreation Therapeutic Interventions Goal: STG-Patient will demonstrate improved self esteem by identif STG: Self-Esteem - Within 4 treatment sessions, patient will verbalize at least 5 positive affirmation statements in each of 2 treatment sessions to increase self-esteem post d/c.  Outcome: Progressing Treatment Session 1; Completed 1 out of 2: At approximately 12:30 pm, LRT met with patient in patient room. Patient verbalized 5 positive affirmation statements. Patient reported it felt good. LRT encouraged patient to continue saying positive affirmation statements.  Leonette Monarch, LRT/CTRS 09.07.16 1:44 pm Goal: STG-Patient will identify at least five coping skills for ** STG: Coping Skills - Within 4 treatment sessions, patient will verbalize at least 5 coping skills for anger in each of 2 treatment sessions to increase anger management skills post d/c.  Outcome: Progressing Treatment Session 1; Completed 1 out of 2: At approximately 12:30 pm, LRT met with patient in patient room. Patient verbalized 5 coping skills for anger. Patient verbalized what triggers him to get angry, and how his body responds to anger. LRT provided suggestions on how patient can remember to use his coping skills.  Leonette Monarch, LRT/CTRS 09.07.16 1:46 pm  Problem: [facility] Participation in Recreation Therapeutic Interventions Goal: STG-Patient will identify at least five coping skills for ** STG: Coping Skills - Within 4 treatment sessions, patient will verbalize at least 5 coping skills for substance abuse in each of 2 treatment sessions to decrease substance abuse post d/c.  Outcome: Progressing Treatment Session 1; Completed 1 out of 2: At approximately 12:30 pm, LRT met with patient in patient room. Patient verbalized 5 coping skills for substance abuse. LRT educated patient on leisure and why it is important to implement into his schedule. LRT provided patient with blank  schedules to help him plan his day and try to avoid using substances. LRT educated patient on healthy support systems.  Leonette Monarch, LRT/CTRS 09.07.16 1:48 pm

## 2015-02-19 NOTE — Progress Notes (Signed)
Patient continues to rate abdominal pain as 10/10 "because I have pancreatitis ..." Initially observed in his room resting at the start of the shift; visible in the milieu and appropriately interacting with peers, scored 2 on CIWA: no intervention required, Librium 50 mg given as ordered.

## 2015-02-19 NOTE — Plan of Care (Signed)
Problem: Ineffective individual coping Goal: STG: Patient will remain free from self harm Outcome: Progressing Medications administered as ordered by the physician, no PRN given, 15 minute checks maintained for safety, clinical and moral support provided, patient encouraged to continue to express feelings and demonstrate safe care. Patient remain free from harm, will continue to monitor.         

## 2015-02-20 MED ORDER — FLUOXETINE HCL 10 MG PO CAPS
10.0000 mg | ORAL_CAPSULE | Freq: Every day | ORAL | Status: DC
  Administered 2015-02-20 – 2015-02-25 (×6): 10 mg via ORAL
  Filled 2015-02-20 (×8): qty 1

## 2015-02-20 MED ORDER — CHLORDIAZEPOXIDE HCL 25 MG PO CAPS
25.0000 mg | ORAL_CAPSULE | Freq: Three times a day (TID) | ORAL | Status: DC
  Administered 2015-02-20 – 2015-02-21 (×3): 25 mg via ORAL
  Filled 2015-02-20 (×3): qty 1

## 2015-02-20 MED ORDER — MIRTAZAPINE 15 MG PO TABS
15.0000 mg | ORAL_TABLET | Freq: Every day | ORAL | Status: DC
  Administered 2015-02-20 – 2015-02-24 (×4): 15 mg via ORAL
  Filled 2015-02-20 (×5): qty 1

## 2015-02-20 NOTE — Progress Notes (Signed)
Pt reports this morning that he has been hearing voices that are telling him to hurt himself. A verbal contract for safety was given. Pt reports that he did not sleep well last night and that his energy level and concentration are low. Pt rates depression as a 10 out of 10, hopelessness 10 out of 10, and anxiety 10 out of 10. Withdrawal symptoms include a mild tremor. Pt reports having pain in his stomach and rates it as an 8 out of 10. When asked what his goal for today was he stated "to stop hearing voices". 15 minute checks are maintained and pt is encouraged to express feelings and attend groups. Pt remains free from harm.

## 2015-02-20 NOTE — [facility] Group Notes (Signed)
[facility] Group Notes:  (Nursing/MHT/Case Management/Adjunct)  Date:  02/20/2015  Time:  2:23 PM  Type of Therapy:  Group Therapy  Participation Level:  Did Not Attend  Participation Quality:  Summary of Progress/Problems:  Walter [name] 02/20/2015, 2:23 PM

## 2015-02-20 NOTE — Progress Notes (Signed)
Recreation Therapy Notes  Date: 09.08.16 Time: 4:40 pm Location: Craft Room  Group Topic: Leisure Education  Goal Area(s) Addresses:  Patient will identify activities for each letter of the alphabet. Patient will verbalize ability to integrate positive leisure into life post d/c. Patient will verbalize ability to use leisure as a Associate Professor.  Behavioral Response: Attentive, Interactive  Intervention: Leisure Alphabet  Activity: Patients were given a Leisure Information systems manager and instructed to write a healthy leisure activity for each letter of the alphabet.   Education: LRT educated patient on things that are needed to participate in leisure  Education Outcome: In group clarification offered  Clinical Observations/Feedback: Patient completed approximately 75% of worksheet. Patient contributed to group discussion by stating some healthy leisure activities he wrote down.  Jacquelynn Cree, LRT/CTRS 02/20/2015 4:53 PM

## 2015-02-20 NOTE — Tx Team (Signed)
Interdisciplinary Treatment Plan Update (Adult)  Date: 02/20/2015 Time Reviewed: 4:48 PM  Progress in Treatment: Attending groups: Yes. Participating in groups: No. Taking medication as prescribed: Yes. Tolerating medication: Yes. Family/Significant othe contact made: No, not yet  Patient understands diagnosis: No. and As evidenced by: Limited insight  Discussing patient identified problems/goals with staff: Yes. Medical problems stabilized or resolved: Yes. Denies suicidal/homicidal ideation: Yes. Issues/concerns per patient self-inventory: No. Other:  New problem(s) identified: No, Describe: NA'  Discharge Plan or Barriers: Pt wants to go to an inpatient substance abuse program. CSW assessing   Reason for Continuation of Hospitalization: Depression  Medication stabilization Suicidal ideation Withdrawal symptoms  Comments: Patient was seen today. Patient continues to isolates to his room. He was seen sleeping late in the morning. Patient has minimal participation in groups. His eye contact is poor. His mood appears depressed and affect is blunted. Patient states he is not better now that when he was admitted. Rates his depression as a 10 out of 10 Patient continues to report depressed mood and auditory hallucinations. Patient also continues to report abdominal pain secondary to his chronic pancreatitis, he grades his pain as a 9 out of 10 (he does not appear to be in significant distress during assessment). The patient c/o nausea and vomiting today. Describes his appetite is limited, his energy is poor his concentration is limited. Patient denies side effects from medications. Denies other physical complaints. Per nursing staff notes patient slept 7 hours last night.  Estimated length of stay: 3 days   New goal(s):  Review of initial/current patient goals per problem list:  1. Goal(s): Patient will participate in aftercare plan  Met:  Target date: at  discharge  As evidenced by: Patient will participate within aftercare plan AEB aftercare provider and housing plan at discharge being identified.  2. Goal (s): Patient will exhibit decreased depressive symptoms and suicidal ideations.  Met:  Target date: at discharge  As evidenced by: Patient will utilize self rating of depression at 3 or below and demonstrate decreased signs of depression or be deemed stable for discharge by MD.  3. Goal(s): Patient will demonstrate decreased signs of withdrawal due to substance abuse  Met:  Target date:at discharge  As evidenced by: Patient will produce a CIWA/COWS score of 0, have stable vitals signs, and no symptoms of withdrawal.   Attendees: Patient:Walter [name]  9/8/20164:48 PM  Family:  9/8/20164:48 PM  Physician: Dr. Jerilee Hoh  9/8/20164:48 PM  Nursing: Elige Radon, RN  9/8/20164:48 PM  Clinical Social Worker: [place], [place]  9/8/20164:48 PM  Counselor:  9/8/20164:48 PM  Other: Everitt Amber, [place]  9/8/20164:48 PM  Other:  9/8/20164:48 PM  Other:  9/8/20164:48 PM  Other:  9/8/20164:48 PM  Other:  9/8/20164:48 PM  Other:  9/8/20164:48 PM  Other:  9/8/20164:48 PM  Other:  9/8/20164:48 PM  Other:  9/8/20164:48 PM  Other:  9/8/20164:48 PM   Scribe for Treatment Team:  Wray Kearns MSW, [place] , 02/20/2015, 4:48 PM

## 2015-02-20 NOTE — Progress Notes (Signed)
[facility] MD Progress Note  02/20/2015 12:37 PM Walter [name]  MRN:  [id number] Subjective:  Patient was seen today. Patient continues to isolates to his room. He was seen sleeping late in the morning. Patient has minimal participation in groups. His eye contact is poor. His mood appears depressed and affect is blunted. Patient states he is not better now that when he was admitted. Rates his depression as a 10 out of 10 Patient continues to report depressed mood and auditory hallucinations.  Patient also continues to report abdominal pain secondary to his chronic pancreatitis, he grades his pain as a 9 out of 10 (he does not appear to be in significant distress during assessment). The patient c/o nausea and vomiting today. Describes his appetite is limited, his energy is poor his concentration is limited.  Patient denies side effects from medications. Denies other physical complaints. Per nursing staff notes patient slept 7 hours last night.  Per nursing: Visible in the milieu, CIWA=2, VSS, denied any form of Hallucinations, denied S/ISIB/HI.  Principal Problem: Severe major depression without psychotic features Diagnosis:   Patient Active Problem List   Diagnosis Date Noted  . Severe major depression without psychotic features [F32.2] 02/19/2015  . Tobacco use disorder [Z72.0] 02/17/2015  . Pancreatitis, chronic [K86.1] 02/17/2015  . HTN (hypertension) [I10] 02/17/2015  . Alcohol withdrawal [F10.239] 02/17/2015  . PTSD (post-traumatic stress disorder) [F43.10] 02/17/2015  . Major depressive disorder, recurrent episode, moderate [F33.1] 02/17/2015  . Alcohol use disorder, severe, dependence [F10.20] 02/16/2015  . Cocaine use disorder, moderate, dependence [F14.20] 02/16/2015   Total Time spent with patient: 30 minutes   Past Medical History:  Past Medical History  Diagnosis Date  . Pancreatitis   . Hypertension   . PTSD (post-traumatic stress disorder)   . Bipolar 1 disorder     Past  Surgical History  Procedure Laterality Date  . Cholecystectomy    . Pancreas surgery     Family History: History reviewed. No pertinent family history. Social History:  History  Alcohol Use  . 7.2 oz/week  . 12 Cans of beer per week    Comment: 1/2 of a 5th a day     History  Drug Use  . Yes  . Special: Cocaine, Marijuana    Comment: uses daily    Social History   Social History  . Marital Status: Widowed    Spouse Name: N/A  . Number of Children: N/A  . Years of Education: N/A   Social History Main Topics  . Smoking status: Current Every Day Smoker -- 0.50 packs/day for 20 years    Types: Cigarettes  . Smokeless tobacco: None  . Alcohol Use: 7.2 oz/week    12 Cans of beer per week     Comment: 1/2 of a 5th a day  . Drug Use: Yes    Special: Cocaine, Marijuana     Comment: uses daily  . Sexual Activity: Yes    Birth Control/ Protection: None   Other Topics Concern  . None   Social History Narrative   Additional History:    Sleep: Good  Appetite:  Fair   Assessment:   Musculoskeletal: Strength & Muscle Tone: within normal limits Gait & Station: normal Patient leans: N/A   Psychiatric Specialty Exam: Physical Exam   Review of Systems  Constitutional: Negative.   HENT: Negative.   Eyes: Negative.   Respiratory: Negative.   Cardiovascular: Negative.   Gastrointestinal: Positive for nausea, vomiting and abdominal pain.  Genitourinary:  Negative.   Musculoskeletal: Negative.   Skin: Negative.   Neurological: Negative.   Endo/Heme/Allergies: Negative.   Psychiatric/Behavioral: Positive for depression, hallucinations and substance abuse. Negative for suicidal ideas.    Blood pressure 124/84, pulse 97, temperature 98.2 F (36.8 C), temperature source Oral, resp. rate 20, height  (1.778 m), weight 78.472 kg (173 lb), SpO2 100 %.Body mass index is 24.82 kg/(m^2).  General Appearance: Fairly Groomed  Patent attorney::  Poor  Speech:  Normal Rate   Volume:  Decreased  Mood:  Dysphoric  Affect:  Blunt  Thought Process:  Linear and Logical  Orientation:  Full (Time, Place, and Person)  Thought Content:  Hallucinations: Auditory  Suicidal Thoughts:  No  Homicidal Thoughts:  No  Memory:  Immediate;   Fair Recent;   Fair Remote;   Fair  Judgement:  Poor  Insight:  Shallow  Psychomotor Activity:  Decreased  Concentration:  NA  Recall:  NA  Fund of Knowledge:Fair  Language: Good  Akathisia:  No  Handed:    AIMS (if indicated):     Assets:  Architect Housing  ADL's:  Intact  Cognition: WNL  Sleep:  Number of Hours: 7.3     Current Medications: Current Facility-Administered Medications  Medication Dose Route Frequency Provider Last Rate Last Dose  . acetaminophen (TYLENOL) tablet 650 mg  650 mg Oral Q6H PRN Jimmy Footman, MD   650 mg at 02/19/15 0855  . alum & mag hydroxide-simeth (MAALOX/MYLANTA) 200-200-20 MG/5ML suspension 30 mL  30 mL Oral Q4H PRN Jimmy Footman, MD      . chlordiazePOXIDE (LIBRIUM) capsule 25 mg  25 mg Oral QID Jimmy Footman, MD   25 mg at 02/20/15 0931  . hydrochlorothiazide (HYDRODIURIL) tablet 25 mg  25 mg Oral Daily Jimmy Footman, MD   25 mg at 02/20/15 0931  . ketorolac (TORADOL) tablet 10 mg  10 mg Oral Q6H PRN Jimmy Footman, MD   10 mg at 02/18/15 1832  . lipase/protease/amylase (CREON) capsule 36,000 Units  36,000 Units Oral TID WC Jimmy Footman, MD   36,000 Units at 02/20/15 1201  . magnesium hydroxide (MILK OF MAGNESIA) suspension 30 mL  30 mL Oral Daily PRN Jimmy Footman, MD      . nicotine (NICODERM CQ - dosed in mg/24 hours) patch 21 mg  21 mg Transdermal Q0600 Jimmy Footman, MD   21 mg at 02/20/15 0932  . pantoprazole (PROTONIX) EC tablet 40 mg  40 mg Oral Daily Jimmy Footman, MD   40 mg at 02/20/15 0931  . promethazine (PHENERGAN) tablet 25 mg   25 mg Oral Q8H PRN Jimmy Footman, MD      . sertraline (ZOLOFT) tablet 25 mg  25 mg Oral Daily Jimmy Footman, MD   25 mg at 02/20/15 0931  . traZODone (DESYREL) tablet 150 mg  150 mg Oral QHS PRN Jimmy Footman, MD        Lab Results: No results found for this or any previous visit (from the past 48 hour(s)).  Physical Findings: AIMS: Facial and Oral Movements Muscles of Facial Expression: None, normal Lips and Perioral Area: None, normal Jaw: None, normal Tongue: None, normal,Extremity Movements Upper (arms, wrists, hands, fingers): None, normal Lower (legs, knees, ankles, toes): None, normal, Trunk Movements Neck, shoulders, hips: None, normal, Overall Severity Severity of abnormal movements (highest score from questions above): None, normal Incapacitation due to abnormal movements: None, normal Patient's awareness of abnormal movements (rate only patient's report): No Awareness, Dental  Status Current problems with teeth and/or dentures?: No Does patient usually wear dentures?: No  CIWA:  CIWA-Ar Total: 5 COWS:     Treatment Plan Summary: Daily contact with patient to assess and evaluate symptoms and progress in treatment and Medication management   Major depressive disorder and posttraumatic stress disorder: Patient is currently not receiving any psychiatric treatment. He used to see a psychiatrist at [facility] but has not been seen there in several months. Zoloft appears to be worsening nausea. Patient stated that his prior admission he received Prozac and Remeron and did well on that patient is willing to restart treatment with these 2 agents instead of the zoloft  Insomnia: Continue trazodone to 150 mg po qhs.  Alcohol withdrawal: Vital signs within the normal limits. Plan to decrease Librium later this evening to 25 mg 3 times a day.   Alcohol, cannabis and cocaine dependence: Patient says he wants to get better and get sober for his family. He  says he has a son and he wants to live and get better for his son.  Tobacco use disorder continue nicotine patch 21 mg  Hypertension patient has been restarted on home dose of hydrochlorothiazide 25 mg a day  Chronic pancreatitis-pain: still c/o pain.  Continue Toradol when necessary.  Continue pancelipase tid.  Nausea: will continue phenergan prn  Precautions every 15 minute checks and withdrawal  Diet low sodium.   Discharge planning: Once a stable patient will be discharged to a residential treatment facility. Referral made to [facility] today and [facility]  Awaiting answer from them.  Medical Decision Making:  Established Problem, Stable/Improving (1)     Jimmy Footman 02/20/2015, 12:37 PM

## 2015-02-20 NOTE — Plan of Care (Signed)
Problem: Ineffective individual coping Goal: STG: Patient will remain free from self harm Outcome: Progressing Medications (Librium 25 mg qhs) administered as ordered by the physician, no PRN given, 15 minute checks maintained for safety, clinical and moral support provided, patient encouraged to continue to express feelings and demonstrate safe care. Patient remain free from harm, will continue to monitor.

## 2015-02-20 NOTE — Plan of Care (Signed)
Problem: Ineffective individual coping Goal: STG: Pt will be able to identify effective and ineffective STG: Pt will be able to identify effective and ineffective coping patterns  Outcome: Not Progressing Pt has been attending minimal groups.

## 2015-02-20 NOTE — [facility] Group Notes (Signed)
[facility] LCSW Group Therapy  02/20/2015 4:20 PM  Type of Therapy:  Group Therapy  Participation Level:  Minimal  Participation Quality:  Attentive  Affect:  Flat  Cognitive:  Alert  Insight: Limited   Engagement in Therapy:  Limited   Modes of Intervention:  Discussion, Education, Socialization and Support  Summary of Progress/Problems: Balance in life: Patients will discuss the concept of balance and how it looks and feels to be unbalanced. Pt will identify areas in their life that is unbalanced and ways to become more balanced.  Walter [name] attended group and stayed the entire time. He sat quietly and listened to other group members. He reports having a headache and stomach pain.   Evelynn Hench L Jennie Hannay MSW, LCSWA  02/20/2015, 4:20 PM

## 2015-02-20 NOTE — [facility] Counselor (Signed)
Adult Comprehensive Assessment  Patient ID: Walter [name], male   DOB: [birth year], 49 y.o.   MRN: [id number]  Information Source: Information source: Patient  Current Stressors:  Family Relationships: distant, stressful Housing / Lack of housing: Pt currently live with his sister. Pt has a conflictual relationship with his sister and would like to seek alternate housing. Physical health (include injuries & life threatening diseases): Pancreatitis Social relationships: limited, most of pt's friends use Substance abuse: alcohol, crack cocaine, marijuana, cigarettes Bereavement / Loss: Wife committed suicide in 2009.    Living/Environment/Situation:  Living Arrangements: Other relatives Living conditions (as described by patient or guardian): crowded, stressful How long has patient lived in current situation?: 4-5 mos What is atmosphere in current home: Chaotic, Temporary  Family History:  Marital status: Widowed Widowed, when?: 2009 Does patient have children?: Yes How many children?: 1 How is patient's relationship with their children?: distant  Childhood History:  By whom was/is the patient raised?: Mother Additional childhood history information: poor Description of patient's relationship with caregiver when they were a child: good, supportive Patient's description of current relationship with people who raised him/her: good relationship with his 48 y/o mother Does patient have siblings?: Yes Number of Siblings: 10 Description of patient's current relationship with siblings: distant, conflictual with one sister Did patient suffer any verbal/emotional/physical/sexual abuse as a child?: No (pt denies) Did patient suffer from severe childhood neglect?: No (pt denies) Has patient ever been sexually abused/assaulted/raped as an adolescent or adult?: No (pt denies) Was the patient ever a victim of a crime or a disaster?: No (none reported) Witnessed domestic violence?: No (pt  denies) Has patient been effected by domestic violence as an adult?: No (pt denies)  Education:  Highest grade of school patient has completed: 12th Currently a student?: No Learning disability?: No  Employment/Work Situation:   Employment situation: On disability Why is patient on disability: chronic pancreatitis How long has patient been on disability: 2005 Patient's job has been impacted by current illness: Yes 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 Has patient ever been in the [employer]?: No Has patient ever served in Buyer, retail?: No  Financial Resources:   Surveyor, quantity resources: Insurance claims handler Does patient have a Lawyer or guardian?: No  Alcohol/Substance Abuse:   What has been your use of drugs/alcohol within the last 12 months?: Daily abuse of alcohol (3 5ths), crack cocaine (8 ball or 16), marijuana ($20-$30 bag) and cigarettes If attempted suicide, did drugs/alcohol play a role in this?: No Alcohol/Substance Abuse Treatment Hx: Past Tx, Inpatient If yes, describe treatment: [facility] 2015  Pt stated he remained sober for 4 mos following inpatient tx at [facility].  Social Support System:   Conservation officer, nature Support System: Poor Describe Community Support System: limited  Leisure/Recreation:   Leisure and Hobbies: watching tv, walking  Strengths/Needs:   What things does the patient do well?: repair things In what areas does patient struggle / problems for patient: addiction  Discharge Plan:   Does patient have access to transportation?: Yes Will patient be returning to same living situation after discharge?: No Plan for living situation after discharge: Pt is interested in finding new housing as he does not want to return to his sister's home due to the conflict and number of people that live in the home. Currently receiving community mental health services: No If no, would patient like referral for services when discharged?: Yes (What  [place]ounty?) ([facility]) Does patient have financial barriers related to  discharge medications?: No  Summary/Recommendations:   Pt is a 49 y/o widowed male who was admitted seeking detox from alcohol, crack cocaine and marijuana.  Pt currently resides with his sister but describes his relationship with his sister as stressful and a trigger for relapse.  Pt has unresolved trauma related to finding his wife who had committed suicide.  Pt stated he has not received treatment for this trauma.  Pt has been abusing substances for many years and agrees that his substance use has worsened since the death of hi wife.  Pt is interested in receiving treatment at [facility] and would like assistance with housing.  Pt is encouraged to participate in the treatment process, group therapy, medication management and discharge planning.    [place], LCSW [phone] [place], Lavonne Chick D. 02/20/2015

## 2015-02-20 NOTE — [facility] Suicide Risk Assessment (Signed)
[facility] INPATIENT:  Family/Significant Other Suicide Prevention Education  Suicide Prevention Education:  Patient Refusal for Family/Significant Other Suicide Prevention Education: The patient Walter [name] has refused to provide written consent for family/significant other to be provided Family/Significant Other Suicide Prevention Education during admission and/or prior to discharge.  Physician notified.  [place] West, [place] D 02/20/2015, 10:26 AM

## 2015-02-20 NOTE — Plan of Care (Signed)
Problem: [facility] Participation in Recreation Therapeutic Interventions Goal: STG-Patient will demonstrate improved self esteem by identif STG: Self-Esteem - Within 4 treatment sessions, patient will verbalize at least 5 positive affirmation statements in each of 2 treatment sessions to increase self-esteem post d/c.  Outcome: Completed/Met Date Met:  02/20/15 Treatment Session 2; Completed 2 out of 2: At approximately 9:10 am, LRT met with patient in patient room. Patient verbalized 5 positive affirmation statements. Patient reported it felt "good". LRT encouraged patient to continue saying positive affirmation statements.  Leonette Monarch, LRT/CTRS 09.08.16 5:04 pm Goal: STG-Patient will identify at least five coping skills for ** STG: Coping Skills - Within 4 treatment sessions, patient will verbalize at least 5 coping skills for anger in each of 2 treatment sessions to increase anger management skills post d/c.  Outcome: Completed/Met Date Met:  02/20/15 Treatment Session 2; Completed 2 out of 2: At approximately 9:10 am, LRT met with patient in patient room. Patient verbalized 5 coping skills for anger. LRT encouraged patient to use his coping skills.  Leonette Monarch, LRT/CTRS 09.08.16 5:05 pm  Problem: [facility] Participation in Recreation Therapeutic Interventions Goal: STG-Patient will identify at least five coping skills for ** STG: Coping Skills - Within 4 treatment sessions, patient will verbalize at least 5 coping skills for substance abuse in each of 2 treatment sessions to decrease substance abuse post d/c.  Outcome: Completed/Met Date Met:  02/20/15 Treatment Session 2; Completed 2 out of 2: At approximately 9:10 am, LRT met with patient in patient room. Patient verbalized 5 coping skills for substance abuse. LRT encouraged patient to use his coping skills.  Leonette Monarch, LRT/CTRS 09.08.16 5:07 pm

## 2015-02-21 MED ORDER — HYDROCHLOROTHIAZIDE 25 MG PO TABS: 25.0000 mg | ORAL_TABLET | Freq: Every day | ORAL | Status: DC

## 2015-02-21 MED ORDER — FLUOXETINE HCL 10 MG PO CAPS: 10.0000 mg | ORAL_CAPSULE | Freq: Every day | ORAL | Status: DC

## 2015-02-21 MED ORDER — PANCRELIPASE (LIP-PROT-AMYL) 36000-114000 UNITS PO CPEP: 36000.0000 [IU] | ORAL_CAPSULE | Freq: Three times a day (TID) | ORAL | Status: DC

## 2015-02-21 MED ORDER — MIRTAZAPINE 15 MG PO TABS: 15.0000 mg | ORAL_TABLET | Freq: Every day | ORAL | Status: DC

## 2015-02-21 NOTE — Progress Notes (Signed)
Recreation Therapy Notes  Date: 09.09.16 Time: 3:00 pm Location: Craft Room  Group Topic: Coping Skills  Goal Area(s) Addresses:  Patient will participate in coping skill. Patient will verbalize benefit of using art as a coping skill.  Behavioral Response: Attentive, Left early  Intervention: Coloring  Activity: Patients were given coloring sheets and instructed to color and think about the emotions they experienced.  Education: LRT educated patients on healthy coping skills.  Education Outcome: Patient left group before LRT educated patients.  Clinical Observations/Feedback: Patient colored worksheet. Patient left group at approximately 3:45 pm. Patient did not return to group.  Jacquelynn Cree, LRT/CTRS 02/21/2015 4:31 PM

## 2015-02-21 NOTE — [facility] Group Notes (Signed)
[facility] LCSW Group Therapy  02/21/2015 2:46 PM  Type of Therapy:  Group Therapy  Participation Level:  Active  Participation Quality:  Appropriate and Attentive  Affect:  Appropriate  Cognitive:  Alert, Appropriate and Oriented  Insight:  Engaged  Engagement in Therapy:  Engaged  Modes of Intervention:  Socialization and Support  Summary of Progress/Problems: Patient attended and participated appropriately during group discussion. Patient shared during introductions the 3 things he would take with him on a deserted [place] are "a fishing rod, clothes, and a net". Patient was able to relate to relapse being related to negative emotions he has experienced.   Lulu Riding, MSW, LCSWA 02/21/2015, 2:46 PM

## 2015-02-21 NOTE — Progress Notes (Signed)
[facility] MD Progress Note  02/21/2015 3:47 PM Walter [name]  MRN:  [id number] Subjective:  Patient was seen today. Patient continues to isolates to his room. He was seen sleeping late in the morning. Patient has minimal participation in groups. His eye contact is poor. His mood appears depressed and affect is blunted. Patient states he is not better now that when he was admitted. Rates his depression as a 10 out of 10 Patient continues to report depressed mood and auditory hallucinations.  Patient also continues to report abdominal pain secondary to his chronic pancreatitis, he grades his pain as a 9 out of 10 (he does not appear to be in significant distress during assessment). The patient c/o nausea and vomiting today. Describes his appetite is limited, his energy is poor his concentration is limited.  Patient denies side effects from medications. Denies other physical complaints.   Per nursing: D: Patient denies SI/HI/AVH. Patient affect is depressed. Mood is pleasant. Patient did attend evening group. Patient visible on the milieu. No distress noted. A: Support and encouragement offered. Scheduled medications given to pt. Q 15 min checks continued for patient safety. R: Patient receptive. Patient remains safe on the unit.   Principal Problem: Severe major depression without psychotic features Diagnosis:   Patient Active Problem List   Diagnosis Date Noted  . Severe major depression without psychotic features [F32.2] 02/19/2015  . Tobacco use disorder [Z72.0] 02/17/2015  . Pancreatitis, chronic [K86.1] 02/17/2015  . HTN (hypertension) [I10] 02/17/2015  . Alcohol withdrawal [F10.239] 02/17/2015  . PTSD (post-traumatic stress disorder) [F43.10] 02/17/2015  . Alcohol use disorder, severe, dependence [F10.20] 02/16/2015  . Cocaine use disorder, moderate, dependence [F14.20] 02/16/2015   Total Time spent with patient: 30 minutes   Past Medical History:  Past Medical History  Diagnosis Date  .  Pancreatitis   . Hypertension   . PTSD (post-traumatic stress disorder)   . Bipolar 1 disorder     Past Surgical History  Procedure Laterality Date  . Cholecystectomy    . Pancreas surgery     Family History: History reviewed. No pertinent family history. Social History:  History  Alcohol Use  . 7.2 oz/week  . 12 Cans of beer per week    Comment: 1/2 of a 5th a day     History  Drug Use  . Yes  . Special: Cocaine, Marijuana    Comment: uses daily    Social History   Social History  . Marital Status: Widowed    Spouse Name: N/A  . Number of Children: N/A  . Years of Education: N/A   Social History Main Topics  . Smoking status: Current Every Day Smoker -- 0.50 packs/day for 20 years    Types: Cigarettes  . Smokeless tobacco: None  . Alcohol Use: 7.2 oz/week    12 Cans of beer per week     Comment: 1/2 of a 5th a day  . Drug Use: Yes    Special: Cocaine, Marijuana     Comment: uses daily  . Sexual Activity: Yes    Birth Control/ Protection: None   Other Topics Concern  . None   Social History Narrative   Additional History:    Sleep: Good  Appetite:  Fair   Assessment:   Musculoskeletal: Strength & Muscle Tone: within normal limits Gait & Station: normal Patient leans: N/A   Psychiatric Specialty Exam: Physical Exam   Review of Systems  Constitutional: Negative.   HENT: Negative.   Eyes: Negative.  Respiratory: Negative.   Cardiovascular: Negative.   Gastrointestinal: Positive for nausea, vomiting and abdominal pain.  Genitourinary: Negative.   Musculoskeletal: Negative.   Skin: Negative.   Neurological: Negative.   Endo/Heme/Allergies: Negative.   Psychiatric/Behavioral: Positive for depression, suicidal ideas, hallucinations and substance abuse.    Blood pressure 113/86, pulse 86, temperature 97.5 F (36.4 C), temperature source Oral, resp. rate 20, height 5\' 10"  (1.778 m), weight 78.472 kg (173 lb), SpO2 100 %.Body mass index is  24.82 kg/(m^2).  General Appearance: Fairly Groomed  Patent attorney::  Poor  Speech:  Normal Rate  Volume:  Decreased  Mood:  Dysphoric  Affect:  Blunt  Thought Process:  Linear and Logical  Orientation:  Full (Time, Place, and Person)  Thought Content:  Hallucinations: Auditory  Suicidal Thoughts:  No  Homicidal Thoughts:  No  Memory:  Immediate;   Fair Recent;   Fair Remote;   Fair  Judgement:  Poor  Insight:  Shallow  Psychomotor Activity:  Decreased  Concentration:  NA  Recall:  NA  Fund of Knowledge:Fair  Language: Good  Akathisia:  No  Handed:    AIMS (if indicated):     Assets:  Architect Housing  ADL's:  Intact  Cognition: WNL  Sleep:  Number of Hours: 6.25     Current Medications: Current Facility-Administered Medications  Medication Dose Route Frequency Provider Last Rate Last Dose  . acetaminophen (TYLENOL) tablet 650 mg  650 mg Oral Q6H PRN Jimmy Footman, MD   650 mg at 02/19/15 0855  . alum & mag hydroxide-simeth (MAALOX/MYLANTA) 200-200-20 MG/5ML suspension 30 mL  30 mL Oral Q4H PRN Jimmy Footman, MD      . FLUoxetine (PROZAC) capsule 10 mg  10 mg Oral Daily Jimmy Footman, MD   10 mg at 02/21/15 0847  . hydrochlorothiazide (HYDRODIURIL) tablet 25 mg  25 mg Oral Daily Jimmy Footman, MD   25 mg at 02/21/15 0847  . ketorolac (TORADOL) tablet 10 mg  10 mg Oral Q6H PRN Jimmy Footman, MD   10 mg at 02/18/15 1832  . lipase/protease/amylase (CREON) capsule 36,000 Units  36,000 Units Oral TID WC Jimmy Footman, MD   36,000 Units at 02/21/15 1307  . magnesium hydroxide (MILK OF MAGNESIA) suspension 30 mL  30 mL Oral Daily PRN Jimmy Footman, MD      . mirtazapine (REMERON) tablet 15 mg  15 mg Oral QHS Jimmy Footman, MD   15 mg at 02/20/15 2216  . nicotine (NICODERM CQ - dosed in mg/24 hours) patch 21 mg  21 mg Transdermal Q0600 Jimmy Footman, MD   21 mg at 02/21/15 0848  . pantoprazole (PROTONIX) EC tablet 40 mg  40 mg Oral Daily Jimmy Footman, MD   40 mg at 02/21/15 0847  . promethazine (PHENERGAN) tablet 25 mg  25 mg Oral Q8H PRN Jimmy Footman, MD      . traZODone (DESYREL) tablet 150 mg  150 mg Oral QHS PRN Jimmy Footman, MD        Lab Results: No results found for this or any previous visit (from the past 48 hour(s)).  Physical Findings: AIMS: Facial and Oral Movements Muscles of Facial Expression: None, normal Lips and Perioral Area: None, normal Jaw: None, normal Tongue: None, normal,Extremity Movements Upper (arms, wrists, hands, fingers): None, normal Lower (legs, knees, ankles, toes): None, normal, Trunk Movements Neck, shoulders, hips: None, normal, Overall Severity Severity of abnormal movements (highest score from questions above): None, normal Incapacitation due to  abnormal movements: None, normal Patient's awareness of abnormal movements (rate only patient's report): No Awareness, Dental Status Current problems with teeth and/or dentures?: No Does patient usually wear dentures?: No  CIWA:  CIWA-Ar Total: 5 COWS:     Treatment Plan Summary: Daily contact with patient to assess and evaluate symptoms and progress in treatment and Medication management   Major depressive disorder and posttraumatic stress disorder: Patient is currently not receiving any psychiatric treatment. He used to see a psychiatrist at [place] but has not been seen there in several months. Zoloft appears to be worsening nausea. Patient stated that his prior admission he received Prozac and Remeron.  On 9/8 he was started on remeron  qhs and fluoxetine 10 mg po q day. Pt still voicing SI.  Insomnia: Continue trazodone to 150 mg po qhs.  Alcohol withdrawal: resolved.  Will d/c librium  Alcohol, cannabis and cocaine dependence: Patient says he wants to get better and get sober for  his family. He says he has a son and he wants to live and get better for his son.  Tobacco use disorder continue nicotine patch 21 mg  Hypertension patient has been restarted on home dose of hydrochlorothiazide 25 mg a day  Chronic pancreatitis-pain: still c/o pain.  Continue Toradol when necessary.  Continue pancelipase tid.  Nausea: will continue phenergan prn  Precautions every 15 minute checks and withdrawal  Diet low sodium.   Discharge planning: Once a stable patient will be discharged to a residential treatment facility. Referral made to [facility] but denied.  SW trying [place] [facility].  Possible d/c Monday.  Medical Decision Making:  Established Problem, Stable/Improving (1)     Jimmy Footman 02/21/2015, 3:47 PM

## 2015-02-21 NOTE — Plan of Care (Signed)
Problem: Ineffective individual coping Goal: LTG: Patient will report a decrease in negative feelings Outcome: Progressing States mood improving Goal: STG: Patient will remain free from self harm Outcome: Progressing No self harm reported or reserved

## 2015-02-21 NOTE — Progress Notes (Signed)
D: Patient denies SI/HI/AVH. Patient affect is flat, Mood is pleasant. Patient did attend  group. Patient visible on the milieu. No distress noted. A: Support and encouragement offered. Scheduled medications given to pt. Q 15 min checks continued for patient safety. R: Patient receptive. Patient remains safe on the unit.

## 2015-02-21 NOTE — Progress Notes (Signed)
D: Patient denies SI/HI/AVH.  Patient affect is depressed. Mood is pleasant.  Patient did attend evening group. Patient visible on the milieu. No distress noted. A: Support and encouragement offered. Scheduled medications given to pt. Q 15 min checks continued for patient safety. R: Patient receptive. Patient remains safe on the unit.

## 2015-02-22 DIAGNOSIS — F332 Major depressive disorder, recurrent severe without psychotic features: Principal | ICD-10-CM

## 2015-02-22 MED ORDER — TRAZODONE HCL 50 MG PO TABS
150.0000 mg | ORAL_TABLET | Freq: Every day | ORAL | Status: DC
  Administered 2015-02-22 – 2015-02-23 (×2): 150 mg via ORAL
  Filled 2015-02-22 (×2): qty 1

## 2015-02-22 NOTE — [facility] Group Notes (Signed)
[facility] LCSW Aftercare Discharge Planning Group Note   02/22/2015 9:30 AM  Participation Quality:  Minimal   Mood/Affect:  Flat  Depression Rating:  0  Anxiety Rating:  0  Thoughts of Suicide:  No Will you contract for safety?   NA  Current AVH:  No  Plan for Discharge/Comments:  Pt states he wants an inpatient substance abuse program. His goal for today is to feel better. He states he is having stomach pain as well as a headache.   Transportation Means: public   Supports: None   Alisan Dokes [employer] MSW, [employer]

## 2015-02-22 NOTE — [facility] Group Notes (Signed)
[facility] Group Notes:  (Nursing/MHT/Case Management/Adjunct)  Date:  02/22/2015  Time:  12:21 PM  Type of Therapy:  Psychoeducational Skills  Participation Level:  Active  Participation Quality:  Appropriate  Affect:  Appropriate  Cognitive:  Appropriate  Insight:  Good  Engagement in Group:  Limited  Modes of Intervention:  Clarification  Summary of Progress/Problems:  Marquette Old 02/22/2015, 12:21 PM

## 2015-02-22 NOTE — Plan of Care (Signed)
Problem: Alteration in mood & ability to function due to Goal: STG-Patient will attend groups Outcome: Progressing Patient attends groups today.

## 2015-02-22 NOTE — Progress Notes (Signed)
Patient is pleasant and cooperative. Sad. Patient states that he has not been sleeping well. Trazodone ordered. Patient contracts for safety and Q15 min Checks maintained during shift. Continue plan and Q 15 min checks for safety.

## 2015-02-22 NOTE — Progress Notes (Addendum)
[facility] MD Progress Note  02/22/2015 2:03 PM Walter [name]  MRN:  [id number] Subjective:  Patient was seen today. He reports feeling all right at this time. He denies any suicidal or homicidal ideation. He states that his mood might be a little bit better. He states he is not sleeping well. In regards to physical complaints he states he continues to have some abdominal pain from his chronic pancreatitis. He states is exacerbated by certain foods such as pasta.  He did relate that insomnia continues to be an issue for him. He states he has not been getting any medication for sleep. His trazodone was written when necessary and patient is not asking for it. Principal Problem: Severe major depression without psychotic features Diagnosis:   Patient Active Problem List   Diagnosis Date Noted  . Severe major depression without psychotic features [F32.2] 02/19/2015  . Tobacco use disorder [Z72.0] 02/17/2015  . Pancreatitis, chronic [K86.1] 02/17/2015  . HTN (hypertension) [I10] 02/17/2015  . Alcohol withdrawal [F10.239] 02/17/2015  . PTSD (post-traumatic stress disorder) [F43.10] 02/17/2015  . Alcohol use disorder, severe, dependence [F10.20] 02/16/2015  . Cocaine use disorder, moderate, dependence [F14.20] 02/16/2015   Total Time spent with patient: 30 minutes   Past Medical History:  Past Medical History  Diagnosis Date  . Pancreatitis   . Hypertension   . PTSD (post-traumatic stress disorder)   . Bipolar 1 disorder     Past Surgical History  Procedure Laterality Date  . Cholecystectomy    . Pancreas surgery     Family History: History reviewed. No pertinent family history. Social History:  History  Alcohol Use  . 7.2 oz/week  . 12 Cans of beer per week    Comment: 1/2 of a 5th a day     History  Drug Use  . Yes  . Special: Cocaine, Marijuana    Comment: uses daily    Social History   Social History  . Marital Status: Widowed    Spouse Name: N/A  . Number of Children:  N/A  . Years of Education: N/A   Social History Main Topics  . Smoking status: Current Every Day Smoker -- 0.50 packs/day for 20 years    Types: Cigarettes  . Smokeless tobacco: None  . Alcohol Use: 7.2 oz/week    12 Cans of beer per week     Comment: 1/2 of a 5th a day  . Drug Use: Yes    Special: Cocaine, Marijuana     Comment: uses daily  . Sexual Activity: Yes    Birth Control/ Protection: None   Other Topics Concern  . None   Social History Narrative   Additional History:    Sleep: Good  Appetite:  Fair   Assessment:   Musculoskeletal: Strength & Muscle Tone: within normal limits Gait & Station: normal Patient leans: N/A   Psychiatric Specialty Exam: Physical Exam  Review of Systems  Constitutional: Negative.   HENT: Negative.   Eyes: Negative.   Respiratory: Negative.   Cardiovascular: Negative.   Gastrointestinal: Positive for nausea, vomiting and abdominal pain.  Genitourinary: Negative.   Musculoskeletal: Negative.   Skin: Negative.   Neurological: Negative.   Endo/Heme/Allergies: Negative.   Psychiatric/Behavioral: Positive for depression, hallucinations and substance abuse. Negative for suicidal ideas.    Blood pressure 117/90, pulse 102, temperature 97.5 F (36.4 C), temperature source Oral, resp. rate 20, height 5\' 10"  (1.778 m), weight 173 lb (78.472 kg), SpO2 100 %.Body mass index is 24.82 kg/(m^2).  General Appearance: Fairly Groomed  Patent attorney::  Poor  Speech:  Normal Rate  Volume:  Decreased  Mood:  Dysphoric  Affect:  Blunt  Thought Process:  Linear and Logical  Orientation:  Full (Time, Place, and Person)  Thought Content:  Hallucinations: Auditory  Suicidal Thoughts:  No  Homicidal Thoughts:  No  Memory:  Immediate;   Fair Recent;   Fair Remote;   Fair  Judgement:  Poor  Insight:  Shallow  Psychomotor Activity:  Decreased  Concentration:  NA  Recall:  NA  Fund of Knowledge:Fair  Language: Good  Akathisia:  No  Handed:     AIMS (if indicated):     Assets:  Architect Housing  ADL's:  Intact  Cognition: WNL  Sleep:  Number of Hours: 8     Current Medications: Current Facility-Administered Medications  Medication Dose Route Frequency Provider Last Rate Last Dose  . acetaminophen (TYLENOL) tablet 650 mg  650 mg Oral Q6H PRN Jimmy Footman, MD   650 mg at 02/19/15 0855  . alum & mag hydroxide-simeth (MAALOX/MYLANTA) 200-200-20 MG/5ML suspension 30 mL  30 mL Oral Q4H PRN Jimmy Footman, MD      . FLUoxetine (PROZAC) capsule 10 mg  10 mg Oral Daily Jimmy Footman, MD   10 mg at 02/22/15 1004  . hydrochlorothiazide (HYDRODIURIL) tablet 25 mg  25 mg Oral Daily Jimmy Footman, MD   25 mg at 02/22/15 1006  . ketorolac (TORADOL) tablet 10 mg  10 mg Oral Q6H PRN Jimmy Footman, MD   10 mg at 02/18/15 1832  . lipase/protease/amylase (CREON) capsule 36,000 Units  36,000 Units Oral TID WC Jimmy Footman, MD   36,000 Units at 02/22/15 1138  . magnesium hydroxide (MILK OF MAGNESIA) suspension 30 mL  30 mL Oral Daily PRN Jimmy Footman, MD      . mirtazapine (REMERON) tablet 15 mg  15 mg Oral QHS Jimmy Footman, MD   15 mg at 02/20/15 2216  . nicotine (NICODERM CQ - dosed in mg/24 hours) patch 21 mg  21 mg Transdermal Q0600 Jimmy Footman, MD   21 mg at 02/22/15 1003  . pantoprazole (PROTONIX) EC tablet 40 mg  40 mg Oral Daily Jimmy Footman, MD   40 mg at 02/22/15 1004  . promethazine (PHENERGAN) tablet 25 mg  25 mg Oral Q8H PRN Jimmy Footman, MD      . traZODone (DESYREL) tablet 150 mg  150 mg Oral QHS PRN Jimmy Footman, MD        Lab Results: No results found for this or any previous visit (from the past 48 hour(s)).  Physical Findings: AIMS: Facial and Oral Movements Muscles of Facial Expression: None, normal Lips and Perioral Area: None,  normal Jaw: None, normal Tongue: None, normal,Extremity Movements Upper (arms, wrists, hands, fingers): None, normal Lower (legs, knees, ankles, toes): None, normal, Trunk Movements Neck, shoulders, hips: None, normal, Overall Severity Severity of abnormal movements (highest score from questions above): None, normal Incapacitation due to abnormal movements: None, normal Patient's awareness of abnormal movements (rate only patient's report): No Awareness, Dental Status Current problems with teeth and/or dentures?: No Does patient usually wear dentures?: No  CIWA:  CIWA-Ar Total: 5 COWS:     Treatment Plan Summary: Daily contact with patient to assess and evaluate symptoms and progress in treatment and Medication management   Major depressive disorder and posttraumatic stress disorder: Patient is currently not receiving any psychiatric treatment. He used to see a Therapist, sports at [facility]  but has not been seen there in several months. Zoloft appears to be worsening nausea. Patient stated that his prior admission he received Prozac and Remeron.  On 9/8 he was started on remeron 15mg  qhs and fluoxetine 10 mg po q day. Pt still voicing SI.  Insomnia: Continue trazodone to 150 mg po qhs. We'll make this order a standing order so that patient will receive it.  Alcohol withdrawal: resolved.    Alcohol, cannabis and cocaine dependence: Patient says he wants to get better and get sober for his family. He says he has a son and he wants to live and get better for his son.  Tobacco use disorder continue nicotine patch 21 mg  Hypertension patient has been restarted on home dose of hydrochlorothiazide 25 mg a day  Chronic pancreatitis-pain: still c/o pain.  Continue Toradol when necessary.  Continue pancelipase tid.  Nausea: will continue phenergan prn  Precautions every 15 minute checks and withdrawal  Diet low sodium.   Discharge planning: Once a stable patient will be discharged to a  residential treatment facility. Referral made to [facility] but denied.  SW trying [place] [facility].  Possible d/c Monday.  Medical Decision Making:  Established Problem, Stable/Improving (1)     Wallace Going 02/22/2015, 2:03 PM

## 2015-02-22 NOTE — Plan of Care (Signed)
Problem: Ineffective individual coping Goal: STG: Patient will remain free from self harm Outcome: Progressing No self-harm.  Goal: STG:Pt. will utilize relaxation techniques to reduce stress STG: Patient will utilize relaxation techniques to reduce stress levels  Outcome: Progressing Patient interactive in the milieu and pleasant with his peers.  Goal: STG-Increase in ability to manage activities of daily living Outcome: Progressing Pt able to complete ADL's independently.

## 2015-02-22 NOTE — [facility] Group Notes (Signed)
[facility] LCSW Group Therapy  02/22/2015 2:26 PM  Type of Therapy:  Group Therapy  Participation Level:  Minimal  Participation Quality:  Attentive  Affect:  Depressed  Cognitive:  Alert  Insight:  Limited  Engagement in Therapy:  Limited  Modes of Intervention:  Discussion, Education, Socialization and Support  Summary of Progress/Problems:Pt will identify unhealthy thoughts and how they impact their emotions and behavior. Pt will be encouraged to discuss these thoughts, emotions and behaviors with the group. Walter [name] attended group and stayed the entire time. Discussed thoughts of suicide and thoughts of worthlessness. He states his depression and anxiety have increased to a 10 out 10. He contributes this to his stomach pain and headache.   Lorelee Mclaurin L Danice Dippolito MSW, LCSWA  02/22/2015, 2:26 PM

## 2015-02-23 DIAGNOSIS — F332 Major depressive disorder, recurrent severe without psychotic features: Secondary | ICD-10-CM | POA: Insufficient documentation

## 2015-02-23 MED ORDER — ALUM & MAG HYDROXIDE-SIMETH 200-200-20 MG/5ML PO SUSP
30.0000 mL | Freq: Once | ORAL | Status: AC
Start: 2015-02-23 — End: 2015-02-23
  Administered 2015-02-23: 30 mL via ORAL
  Filled 2015-02-23: qty 30

## 2015-02-23 MED ORDER — QUETIAPINE FUMARATE 25 MG PO TABS
50.0000 mg | ORAL_TABLET | Freq: Every day | ORAL | Status: DC
  Administered 2015-02-23: 50 mg via ORAL
  Filled 2015-02-23: qty 2

## 2015-02-23 NOTE — Progress Notes (Signed)
In dayroom watching TV at onset of shift. Was medication compliant. Pleasant to interact with. Requested assistance with shaving. MHT present while using razor and razor disposed of after use. Denied AVH, SI, HI. Had an uneventful night.

## 2015-02-23 NOTE — [facility] Group Notes (Signed)
[facility] LCSW Group Therapy  02/23/2015 2:58 PM  Type of Therapy:  Group Therapy  Participation Level:  Minimal  Participation Quality:  Attentive  Affect:  Flat  Cognitive:  Alert  Insight:  Limited  Engagement in Therapy:  Limited  Modes of Intervention:  Discussion, Education, Socialization and Support  Summary of Progress/Problems: Feelings around Relapse. Group members discussed the meaning of relapse and shared personal stories of relapse, how it affected them and others, and how they perceived themselves during this time. Group members were encouraged to identify triggers, warning signs and coping skills used when facing the possibility of relapse. Social supports were discussed and explored in detail. Josten attended group briefly. He sat quietly and listened to other group members. He states his headache has slightly improved but he still has stomach pain.   [employer] MSW, LCSWA  02/23/2015, 2:58 PM

## 2015-02-23 NOTE — Progress Notes (Signed)
[facility] MD Progress Note  02/23/2015 12:04 PM Walter [name]  MRN:  [id number] Subjective:  Patient said his mood was good. He states he slept well last night. He states that his appetite is been good but he continues to have an upset stomach which he attributes to angry otitis. He states that he usually takes things like Percocet for this. I did offer him some Maalox and he stated he would give this a try. He denies any suicidal thoughts.  However after he and I spoke he then saw me in the hallway and then told me he forgot to tell me he continues to have auditory hallucinations telling him to hurt himself, but denies any intent or plan to act on these.   Principal Problem: Severe major depression without psychotic features Diagnosis:   Patient Active Problem List   Diagnosis Date Noted  . Severe major depression without psychotic features [F32.2] 02/19/2015  . Tobacco use disorder [Z72.0] 02/17/2015  . Pancreatitis, chronic [K86.1] 02/17/2015  . HTN (hypertension) [I10] 02/17/2015  . Alcohol withdrawal [F10.239] 02/17/2015  . PTSD (post-traumatic stress disorder) [F43.10] 02/17/2015  . Alcohol use disorder, severe, dependence [F10.20] 02/16/2015  . Cocaine use disorder, moderate, dependence [F14.20] 02/16/2015   Total Time spent with patient: 30 minutes   Past Medical History:  Past Medical History  Diagnosis Date  . Pancreatitis   . Hypertension   . PTSD (post-traumatic stress disorder)   . Bipolar 1 disorder     Past Surgical History  Procedure Laterality Date  . Cholecystectomy    . Pancreas surgery     Family History: History reviewed. No pertinent family history. Social History:  History  Alcohol Use  . 7.2 oz/week  . 12 Cans of beer per week    Comment: 1/2 of a 5th a day     History  Drug Use  . Yes  . Special: Cocaine, Marijuana    Comment: uses daily    Social History   Social History  . Marital Status: Widowed    Spouse Name: N/A  . Number of Children:  N/A  . Years of Education: N/A   Social History Main Topics  . Smoking status: Current Every Day Smoker -- 0.50 packs/day for 20 years    Types: Cigarettes  . Smokeless tobacco: None  . Alcohol Use: 7.2 oz/week    12 Cans of beer per week     Comment: 1/2 of a 5th a day  . Drug Use: Yes    Special: Cocaine, Marijuana     Comment: uses daily  . Sexual Activity: Yes    Birth Control/ Protection: None   Other Topics Concern  . None   Social History Narrative   Additional History:    Sleep: Good  Appetite:  Fair   Assessment:   Musculoskeletal: Strength & Muscle Tone: within normal limits Gait & Station: normal Patient leans: N/A   Psychiatric Specialty Exam: Physical Exam  Review of Systems  Constitutional: Negative.   HENT: Negative.   Eyes: Negative.   Respiratory: Negative.   Cardiovascular: Negative.   Gastrointestinal: Positive for nausea, vomiting and abdominal pain.  Genitourinary: Negative.   Musculoskeletal: Negative.   Skin: Negative.   Neurological: Negative.   Endo/Heme/Allergies: Negative.   Psychiatric/Behavioral: Positive for depression, hallucinations and substance abuse. Negative for suicidal ideas.    Blood pressure 104/74, pulse 101, temperature 97.5 F (36.4 C), temperature source Oral, resp. rate 20, height 5\' 10"  (1.778 m), weight 173 lb (78.472  kg), SpO2 100 %.Body mass index is 24.82 kg/(m^2).  General Appearance: Fairly Groomed  Patent attorney::  Poor  Speech:  Normal Rate  Volume:  Decreased  Mood:  Dysphoric  Affect:  Blunt  Thought Process:  Linear and Logical  Orientation:  Full (Time, Place, and Person)  Thought Content:  Hallucinations: Auditory  Suicidal Thoughts:  No  Homicidal Thoughts:  No  Memory:  Immediate;   Fair Recent;   Fair Remote;   Fair  Judgement:  Poor  Insight:  Shallow  Psychomotor Activity:  Decreased  Concentration:  NA  Recall:  NA  Fund of Knowledge:Fair  Language: Good  Akathisia:  No  Handed:     AIMS (if indicated):     Assets:  Architect Housing  ADL's:  Intact  Cognition: WNL  Sleep:  Number of Hours: 7.25     Current Medications: Current Facility-Administered Medications  Medication Dose Route Frequency Provider Last Rate Last Dose  . acetaminophen (TYLENOL) tablet 650 mg  650 mg Oral Q6H PRN Jimmy Footman, MD   650 mg at 02/19/15 0855  . alum & mag hydroxide-simeth (MAALOX/MYLANTA) 200-200-20 MG/5ML suspension 30 mL  30 mL Oral Q4H PRN Jimmy Footman, MD      . FLUoxetine (PROZAC) capsule 10 mg  10 mg Oral Daily Jimmy Footman, MD   10 mg at 02/23/15 0928  . hydrochlorothiazide (HYDRODIURIL) tablet 25 mg  25 mg Oral Daily Jimmy Footman, MD   25 mg at 02/23/15 0928  . ketorolac (TORADOL) tablet 10 mg  10 mg Oral Q6H PRN Jimmy Footman, MD   10 mg at 02/18/15 1832  . lipase/protease/amylase (CREON) capsule 36,000 Units  36,000 Units Oral TID WC Jimmy Footman, MD   36,000 Units at 02/23/15 1152  . magnesium hydroxide (MILK OF MAGNESIA) suspension 30 mL  30 mL Oral Daily PRN Jimmy Footman, MD      . mirtazapine (REMERON) tablet 15 mg  15 mg Oral QHS Jimmy Footman, MD   15 mg at 02/22/15 2123  . nicotine (NICODERM CQ - dosed in mg/24 hours) patch 21 mg  21 mg Transdermal Q0600 Jimmy Footman, MD   21 mg at 02/23/15 0928  . pantoprazole (PROTONIX) EC tablet 40 mg  40 mg Oral Daily Jimmy Footman, MD   40 mg at 02/23/15 0928  . promethazine (PHENERGAN) tablet 25 mg  25 mg Oral Q8H PRN Jimmy Footman, MD      . traZODone (DESYREL) tablet 150 mg  150 mg Oral QHS Kerin Salen, MD   150 mg at 02/22/15 2123    Lab Results: No results found for this or any previous visit (from the past 48 hour(s)).  Physical Findings: AIMS: Facial and Oral Movements Muscles of Facial Expression: None, normal Lips and Perioral  Area: None, normal Jaw: None, normal Tongue: None, normal,Extremity Movements Upper (arms, wrists, hands, fingers): None, normal Lower (legs, knees, ankles, toes): None, normal, Trunk Movements Neck, shoulders, hips: None, normal, Overall Severity Severity of abnormal movements (highest score from questions above): None, normal Incapacitation due to abnormal movements: None, normal Patient's awareness of abnormal movements (rate only patient's report): No Awareness, Dental Status Current problems with teeth and/or dentures?: No Does patient usually wear dentures?: No  CIWA:  CIWA-Ar Total: 5 COWS:     Treatment Plan Summary: Daily contact with patient to assess and evaluate symptoms and progress in treatment and Medication management   Major depressive disorder and posttraumatic stress disorder: Patient is currently  not receiving any psychiatric treatment. He used to see a psychiatrist at [facility] but has not been seen there in several months. Zoloft appears to be worsening nausea. Patient stated that his prior admission he received Prozac and Remeron.  On 9/8 he was started on remeron 15mg  qhs and fluoxetine 10 mg po q day. Pt still voicing SI. We will start some Seroquel 50 mg at bedtime to address his complaint of auditory hallucinations.  Insomnia: Continue trazodone to 150 mg po qhs. We'll make this order a standing order so that patient will receive it.  Alcohol withdrawal: resolved.    Alcohol, cannabis and cocaine dependence: Patient says he wants to get better and get sober for his family. He says he has a son and he wants to live and get better for his son.  Tobacco use disorder continue nicotine patch 21 mg  Hypertension patient has been restarted on home dose of hydrochlorothiazide 25 mg a day  GI upset-pain: still c/o pain.  Continue Toradol when necessary.  Continue pancelipase tid. Will give him some maalox.  Nausea: will continue phenergan prn  Precautions every 15  minute checks and withdrawal  Diet low sodium.   Discharge planning: Once a stable patient will be discharged to a residential treatment facility. Referral made to [facility] but denied.  SW trying [place] rescue [facility].  Possible d/c Monday.  Medical Decision Making:  Established Problem, Stable/Improving (1)     Wallace Going 02/23/2015, 12:04 PM

## 2015-02-24 DIAGNOSIS — F329 Major depressive disorder, single episode, unspecified: Secondary | ICD-10-CM | POA: Diagnosis present

## 2015-02-24 DIAGNOSIS — F32A Depression, unspecified: Secondary | ICD-10-CM | POA: Diagnosis present

## 2015-02-24 MED ORDER — HALOPERIDOL 0.5 MG PO TABS
0.5000 mg | ORAL_TABLET | Freq: Three times a day (TID) | ORAL | Status: DC
  Administered 2015-02-24 – 2015-02-25 (×4): 0.5 mg via ORAL
  Filled 2015-02-24 (×4): qty 1
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
## 2015-02-24 NOTE — Plan of Care (Signed)
Problem: Ineffective individual coping Goal: STG: Patient will remain free from self harm Outcome: Progressing Medications administered as ordered by the physician, no PRN given, 15 minute checks maintained for safety, clinical and moral support provided, patient encouraged to continue to express feelings and demonstrate safe care. Patient remain free from harm, will continue to monitor.         

## 2015-02-24 NOTE — [facility] Group Notes (Signed)
[facility] Group Notes:  (Nursing/MHT/Case Management/Adjunct)  Date:  02/24/2015  Time:  9:31 PM  Type of Therapy:  Group Therapy  Participation Level:  Active  Participation Quality:  Appropriate  Affect:  Appropriate  Cognitive:  Appropriate  Insight:  Appropriate  Engagement in Group:  Improving  Modes of Intervention:  Discussion  Summary of Progress/Problems:  Walter [name] 02/24/2015, 9:31 PM

## 2015-02-24 NOTE — Plan of Care (Signed)
Problem: Diagnosis: Increased Risk For Suicide Attempt Goal: LTG-Patient Will Report Improvement in Psychotic Symptoms LTG (by discharge) : Patient will report improvement in psychotic symptoms.  Outcome: Not Progressing Pt +ve AH- command, but contracts for safety

## 2015-02-24 NOTE — Progress Notes (Signed)
D: Patient has flat, sad affect today. States he is depressed and having SI. He contracts for safety. Also endorses HI toward his sister. He says he is having AH that tell him to leave the hospital and kill his sister. Attending groups and eating meals. A: Remains on q 15 minute checks for safety. Provided emotional support. Given meds. R: Cooperative but sad.

## 2015-02-24 NOTE — Progress Notes (Signed)
D:  Pt +ve SI-contracts for safety, HI-someone outside here and AH (command)- contracts for safety. Pt denies VH. Pt is pleasant and cooperative. Pt stated he was feeling the same since coming in here, but pt stated  " I came in for the voices the alcohol and the crack". Pt feels hopeful for the future and his recovery.  A: Pt was offered support and encouragement. Pt was given scheduled medications. Pt was encourage to attend groups. Q 15 minute checks were done for safety.  R:Pt attends groups and interacts well with peers and staff. Pt is taking medication. Pt has no complaints at this time .Pt receptive to treatment and safety maintained on unit.

## 2015-02-24 NOTE — [facility] Group Notes (Signed)
[facility] Group Notes:  (Nursing/MHT/Case Management/Adjunct)  Date:  02/24/2015  Time:  2:11 PM  Type of Therapy:  Psychoeducational Skills  Participation Level:  Minimal  Participation Quality:  listening  Affect:  Flat  Cognitive:  Appropriate  Insight:  Improving  Engagement in Group:  Limited  Modes of Intervention:  Discussion  Summary of Progress/Problems:  Marquette Old 02/24/2015, 2:11 PM

## 2015-02-24 NOTE — Progress Notes (Signed)
Observed in the Day Room interacting well with others, called to a private area to maintain confidentiality of PHI. Patient said, "I am not ready to be discharged yet, I am hearing voices, I still have thoughts of killing myself and my sister, I still hear voices ..." Medication compliant, will continue to provide support and maintain a safe environment.

## 2015-02-24 NOTE — [facility] Suicide Risk Assessment (Addendum)
[facility] Discharge Suicide Risk Assessment   Demographic Factors:  Male, Divorced or widowed, Low socioeconomic status, Living alone and Unemployed  Total Time spent with patient: 30 minutes  Psychiatric Specialty Exam: Physical Exam  ROS                                                         Have you used any form of tobacco in the last 30 days? (Cigarettes, Smokeless Tobacco, Cigars, and/or Pipes): Yes  Has this patient used any form of tobacco in the last 30 days? (Cigarettes, Smokeless Tobacco, Cigars, and/or Pipes) Yes, A prescription for an FDA-approved tobacco cessation medication was offered at discharge and the patient refused  Mental Status Per Nursing Assessment::   On Admission:  Suicidal ideation indicated by patient, Suicide plan  Current Mental Status by Physician: Patient has been reporting a suicide with plan to overdose with anti-freeze since this weekend as he knew he was going to be discharged on Monday .  Prior to that the patient was not reporting SI or HI. He didn't reported passive suicidal thoughts but no intention or plan to act on them. He also reported having auditory hallucinations where he was saying he was hearing voices telling him to drink. However when faced with discharge the patient started saying that the voices were commanding him to kill himself he told a different story to the nurses saying that the voices were telling him to kill his sister.    Loss Factors: Loss of significant relationship  Historical Factors: Prior suicide attempts, Anniversary of important loss and Impulsivity  Risk Reduction Factors:   Sense of responsibility to family  Continued Clinical Symptoms:  Depression:   Comorbid alcohol abuse/dependence Severe Alcohol/Substance Abuse/Dependencies Chronic Pain More than one psychiatric diagnosis Previous Psychiatric Diagnoses and Treatments Medical Diagnoses and Treatments/Surgeries  Cognitive Features  That Contribute To Risk:  None    Suicide Risk:  Minimal: No identifiable suicidal ideation.  Patients presenting with no risk factors but with morbid ruminations; may be classified as minimal risk based on the severity of the depressive symptoms  Principal Problem: Severe major depression without psychotic features Discharge Diagnoses:  Patient Active Problem List   Diagnosis Date Noted  . Depression [F32.9] 02/24/2015  . Major depressive disorder, recurrent, severe without psychotic features [F33.2]   . Severe major depression without psychotic features [F32.2] 02/19/2015  . Tobacco use disorder [Z72.0] 02/17/2015  . Pancreatitis, chronic [K86.1] 02/17/2015  . HTN (hypertension) [I10] 02/17/2015  . Alcohol withdrawal [F10.239] 02/17/2015  . PTSD (post-traumatic stress disorder) [F43.10] 02/17/2015  . Alcohol use disorder, severe, dependence [F10.20] 02/16/2015  . Cocaine use disorder, moderate, dependence [F14.20] 02/16/2015     Is patient on multiple antipsychotic therapies at discharge:  No   Has Patient had three or more failed trials of antipsychotic monotherapy by history:  No  Recommended Plan for Multiple Antipsychotic Therapies: NA    Jimmy Footman 02/25/2015, 9:45 AM

## 2015-02-24 NOTE — [facility] Group Notes (Signed)
[facility] LCSW Group Therapy  02/24/2015 5:09 PM  Type of Therapy:  Group Therapy  Participation Level:  Active  Participation Quality:  Appropriate and Attentive  Affect:  Appropriate  Cognitive:  Alert, Appropriate and Oriented  Insight:  Improving  Engagement in Therapy:  Improving  Modes of Intervention:  Socialization and Support  Summary of Progress/Problems: Patient participated in group appropriately sharing that he has dealt with grief and loss of his wife but has not been able to cope with emotions he is experiencing concerning her loss.  Lulu Riding, MSW, LCSWA 02/24/2015, 5:09 PM

## 2015-02-24 NOTE — Plan of Care (Signed)
Problem: Ineffective individual coping Goal: LTG: Patient will report a decrease in negative feelings Outcome: Not Progressing Pt stated he felt about the same. Goal: STG: Patient will remain free from self harm Outcome: Progressing Pt safe on the unit

## 2015-02-24 NOTE — Progress Notes (Signed)
Recreation Therapy Notes  Date: 09.12.16 Time: 3:00 pm Location: Craft Room  Group Topic: Self-expression  Goal Area(s) Addresses:  Patient will identify one color per emotion listed on wheel. Patient will verbalize benefit of using art as a means of self-expression. Patient will verbalize one emotion experienced during session. Patient will be educated on other forms of self-expression.  Behavioral Response: Attentive, Interactive  Intervention: Emotion Wheel  Activity: Patients were given a worksheet with 7 different emotions and were instructed to pick a color for each emotion.  Education: LRT educated patient on different forms of self-expression.   Education Outcome: Acknowledges education/In group clarification offered  Clinical Observations/Feedback: Patient picked a different color for each emotion. Patient contributed to group discussion by stating what colors he picked for certain emotions and how it felt to see his emotions in color.  Jacquelynn Cree, LRT/CTRS 02/24/2015 4:50 PM

## 2015-02-24 NOTE — Plan of Care (Signed)
Problem: Alteration in mood & ability to function due to Goal: LTG-Pt reports reduction in suicidal thoughts (Patient reports reduction in suicidal thoughts and is able to verbalize a safety plan for whenever patient is feeling suicidal)  Outcome: Not Progressing Pt SI at this time but contracts for safety.      

## 2015-02-25 MED ORDER — HALOPERIDOL 1 MG PO TABS: 1.0000 mg | ORAL_TABLET | Freq: Every day | ORAL | Status: DC

## 2015-02-25 NOTE — Progress Notes (Signed)
  [facility] Adult Case Management Discharge Plan :  Will you be returning to the same living situation after discharge:  No. At discharge, do you have transportation home?: No. Will provide Bus ticket and fare for [employer] Do you have the ability to pay for your medications: Yes,     Release of information consent forms completed and in the chart;  Patient's signature needed at discharge.  Patient to Follow up at: Follow-up Information    Go to [facility].   Why:  Walk in appointments Tuesday between 9am-12pm.  , Hospital Follow up, Outpatient Medication Management   Contact information:   [address] [place] [phone]      Patient denies SI/HI: Yes,       Safety Planning and Suicide Prevention discussed: Yes,     Have you used any form of tobacco in the last 30 days? (Cigarettes, Smokeless Tobacco, Cigars, and/or Pipes): Yes  Has patient been referred to the Quitline?: Patient refused referral  Glennon Mac, MSW, LCSW 02/25/2015, 11:58 AM

## 2015-02-25 NOTE — Progress Notes (Signed)
Recreation Therapy Notes  INPATIENT RECREATION TR PLAN  Patient Details Name: Walter [name] MRN: [id number] DOB: 12/20/1965 Today's Date: 02/25/2015  Rec Therapy Plan Is patient appropriate for Therapeutic Recreation?: Yes Treatment times per week: At least once a week TR Treatment/Interventions: 1:1 session, Group participation (Comment) (Appropriate participation in daily recreation therapy tx)  Discharge Criteria Pt will be discharged from therapy if:: Discharged Treatment plan/goals/alternatives discussed and agreed upon by:: Patient/family  Discharge Summary Short term goals set: See Care Plan Short term goals met: Complete Progress toward goals comments: One-to-one attended Which groups?: Other (Comment), Coping skills, Leisure education, Self-esteem (Self-expression) One-to-one attended: Self-esteem, stress management, coping skills Reason goals not met: N/A Therapeutic equipment acquired: None Reason patient discharged from therapy: Discharge from hospital Pt/family agrees with progress & goals achieved: Yes Date patient discharged from therapy: 02/25/15   Leonette Monarch, LRT/CTRS 02/25/2015, 2:12 PM

## 2015-02-25 NOTE — [facility] Suicide Risk Assessment (Signed)
[facility] INPATIENT:  Family/Significant Other Suicide Prevention Education  Suicide Prevention Education:  Patient Refusal for Family/Significant Other Suicide Prevention Education: The patient Walter [name] has refused to provide written consent for family/significant other to be provided Family/Significant Other Suicide Prevention Education during admission and/or prior to discharge.  Physician notified.  Glennon Mac, MSW, LCSW 02/25/2015, 11:57 AM

## 2015-02-25 NOTE — Progress Notes (Signed)
D: Patient flat  With depressed mood . Continue to  wear scrubs.  Denies homicidal ideations towards his sister , . Up for meals  Appetite good , voice no concerns around sleep . Patient aware of discharge this shift . Patient returning [facility] . Patient received all belonging locked up . Patient denies  Suicidal  And homicidal ideations  .  A: Writer instructed on discharge criteria  . Prescriptions  given to patient. Aware  Of follow up appointment . Patient  Will be leaving on a bus R: Patient left unit with no questions  Or concerns

## 2015-08-18 ENCOUNTER — Encounter: Payer: Self-pay | Admitting: *Deleted

## 2015-08-18 ENCOUNTER — Emergency Department
Admission: EM | Admit: 2015-08-18 | Discharge: 2015-08-19 | Disposition: A | Discharge status: Other Acute Inpatient Hospital | Payer: Medicare Other | Attending: Emergency Medicine | Admitting: Emergency Medicine

## 2015-08-18 DIAGNOSIS — R440 Auditory hallucinations: Secondary | ICD-10-CM | POA: Insufficient documentation

## 2015-08-18 DIAGNOSIS — Z79899 Other long term (current) drug therapy: Secondary | ICD-10-CM | POA: Insufficient documentation

## 2015-08-18 DIAGNOSIS — K861 Other chronic pancreatitis: Secondary | ICD-10-CM | POA: Diagnosis present

## 2015-08-18 DIAGNOSIS — F1410 Cocaine abuse, uncomplicated: Secondary | ICD-10-CM | POA: Diagnosis not present

## 2015-08-18 DIAGNOSIS — F1210 Cannabis abuse, uncomplicated: Secondary | ICD-10-CM | POA: Diagnosis not present

## 2015-08-18 DIAGNOSIS — F1994 Other psychoactive substance use, unspecified with psychoactive substance-induced mood disorder: Secondary | ICD-10-CM | POA: Diagnosis not present

## 2015-08-18 DIAGNOSIS — F1420 Cocaine dependence, uncomplicated: Secondary | ICD-10-CM | POA: Diagnosis present

## 2015-08-18 DIAGNOSIS — F17210 Nicotine dependence, cigarettes, uncomplicated: Secondary | ICD-10-CM | POA: Insufficient documentation

## 2015-08-18 DIAGNOSIS — F1910 Other psychoactive substance abuse, uncomplicated: Secondary | ICD-10-CM

## 2015-08-18 DIAGNOSIS — I10 Essential (primary) hypertension: Secondary | ICD-10-CM | POA: Diagnosis not present

## 2015-08-18 DIAGNOSIS — F1020 Alcohol dependence, uncomplicated: Secondary | ICD-10-CM | POA: Diagnosis present

## 2015-08-18 LAB — COMPREHENSIVE METABOLIC PANEL
ALT: 91 U/L — ABNORMAL HIGH (ref 17–63)
AST: 165 U/L — ABNORMAL HIGH (ref 15–41)
Albumin: 4.3 g/dL (ref 3.5–5.0)
Alkaline Phosphatase: 109 U/L (ref 38–126)
Anion gap: 14 (ref 5–15)
BUN: 14 mg/dL (ref 6–20)
CO2: 22 mmol/L (ref 22–32)
Calcium: 9 mg/dL (ref 8.9–10.3)
Chloride: 101 mmol/L (ref 101–111)
Creatinine, Ser: 1.52 mg/dL — ABNORMAL HIGH (ref 0.61–1.24)
GFR calc Af Amer: 60 mL/min — ABNORMAL LOW (ref >60)
GFR calc non Af Amer: 51 mL/min — ABNORMAL LOW (ref >60)
Glucose, Bld: 87 mg/dL (ref 65–99)
Potassium: 4.1 mmol/L (ref 3.5–5.1)
Sodium: 137 mmol/L (ref 135–145)
Total Bilirubin: 0.5 mg/dL (ref 0.3–1.2)
Total Protein: 7.7 g/dL (ref 6.5–8.1)

## 2015-08-18 LAB — CBC WITH DIFFERENTIAL/PLATELET
Basophils Absolute: 0.1 10*3/uL (ref 0–0.1)
Basophils Relative: 1 %
Eosinophils Absolute: 0.2 10*3/uL (ref 0–0.7)
Eosinophils Relative: 3 %
HCT: 46.9 % (ref 40.0–52.0)
Hemoglobin: 15.9 g/dL (ref 13.0–18.0)
Lymphocytes Relative: 35 %
Lymphs Abs: 2.1 10*3/uL (ref 1.0–3.6)
MCH: 31.4 pg (ref 26.0–34.0)
MCHC: 34 g/dL (ref 32.0–36.0)
MCV: 92.3 fL (ref 80.0–100.0)
Monocytes Absolute: 1.1 10*3/uL — ABNORMAL HIGH (ref 0.2–1.0)
Monocytes Relative: 18 %
Neutro Abs: 2.5 10*3/uL (ref 1.4–6.5)
Neutrophils Relative %: 43 %
Platelets: 236 10*3/uL (ref 150–440)
RBC: 5.08 MIL/uL (ref 4.40–5.90)
RDW: 13.7 % (ref 11.5–14.5)
WBC: 5.9 10*3/uL (ref 3.8–10.6)

## 2015-08-18 LAB — SALICYLATE LEVEL: Salicylate Lvl: <4 mg/dL (ref 2.8–30.0)

## 2015-08-18 LAB — URINE DRUG SCREEN, QUALITATIVE (ARMC ONLY)
Amphetamines, Ur Screen: NOT DETECTED
Barbiturates, Ur Screen: NOT DETECTED
Benzodiazepine, Ur Scrn: NOT DETECTED
Cannabinoid 50 Ng, Ur ~~LOC~~: POSITIVE — AB
Cocaine Metabolite,Ur ~~LOC~~: POSITIVE — AB
MDMA (Ecstasy)Ur Screen: NOT DETECTED
Methadone Scn, Ur: NOT DETECTED
Opiate, Ur Screen: NOT DETECTED
Phencyclidine (PCP) Ur S: NOT DETECTED
Tricyclic, Ur Screen: NOT DETECTED

## 2015-08-18 LAB — ETHANOL: Alcohol, Ethyl (B): 189 mg/dL — ABNORMAL HIGH (ref <5)

## 2015-08-18 LAB — ACETAMINOPHEN LEVEL: Acetaminophen (Tylenol), Serum: <10 ug/mL — ABNORMAL LOW (ref 10–30)

## 2015-08-18 MED ORDER — VITAMIN B-1 100 MG PO TABS
100.0000 mg | ORAL_TABLET | Freq: Every day | ORAL | Status: DC
  Administered 2015-08-18: 100 mg via ORAL
  Filled 2015-08-18: qty 1

## 2015-08-18 MED ORDER — LORAZEPAM 2 MG PO TABS: 0.0000 mg | ORAL_TABLET | Freq: Four times a day (QID) | ORAL | Status: DC

## 2015-08-18 MED ORDER — LORAZEPAM 2 MG PO TABS: 0.0000 mg | ORAL_TABLET | Freq: Two times a day (BID) | ORAL | Status: DC

## 2015-08-18 MED ORDER — MIRTAZAPINE 15 MG PO TABS
15.0000 mg | ORAL_TABLET | Freq: Every day | ORAL | Status: DC
  Administered 2015-08-18: 15 mg via ORAL
  Filled 2015-08-18 (×2): qty 1

## 2015-08-18 MED ORDER — PANCRELIPASE (LIP-PROT-AMYL) 12000-38000 UNITS PO CPEP
36000.0000 [IU] | ORAL_CAPSULE | Freq: Three times a day (TID) | ORAL | Status: DC
  Administered 2015-08-18: 36000 [IU] via ORAL
  Filled 2015-08-18 (×5): qty 3

## 2015-08-18 MED ORDER — HYDROCHLOROTHIAZIDE 25 MG PO TABS
25.0000 mg | ORAL_TABLET | Freq: Every day | ORAL | Status: DC
  Administered 2015-08-18: 25 mg via ORAL
  Filled 2015-08-18: qty 1

## 2015-08-18 MED ORDER — HALOPERIDOL 0.5 MG PO TABS
1.0000 mg | ORAL_TABLET | Freq: Every day | ORAL | Status: DC
  Administered 2015-08-18: 1 mg via ORAL
  Filled 2015-08-18: qty 1

## 2015-08-18 MED ORDER — THIAMINE HCL 100 MG/ML IJ SOLN: 100.0000 mg | Freq: Every day | INTRAMUSCULAR | Status: DC

## 2015-08-18 NOTE — Progress Notes (Signed)
LCSW was advised that patient will be admitted by Dr Toni Amendlapacs for BMU   Mystic Labo LCSW

## 2015-08-18 NOTE — Consult Note (Signed)
[facility] Face-to-Face Psychiatry Consult   Reason for Consult:  Consult for this 50 year old man with a history of alcohol and cocaine abuse as well as mood symptoms who comes into the hospital because of hallucinations and suicidal thoughts. Referring Physician:  Jacqualine Code Patient Identification: Walter [name] MRN:  [id number] Principal Diagnosis: Substance induced mood disorder ([place]) Diagnosis:   Patient Active Problem List   Diagnosis Date Noted  . Substance induced mood disorder ([place]) [F19.94] 08/18/2015  . Depression [F32.9] 02/24/2015  . Major depressive disorder, recurrent, severe without psychotic features ([place]) [F33.2]   . Severe major depression without psychotic features ([place]) [F32.2] 02/19/2015  . Tobacco use disorder [F17.200] 02/17/2015  . Pancreatitis, chronic ([place]) [K86.1] 02/17/2015  . HTN (hypertension) [I10] 02/17/2015  . Alcohol withdrawal ([place]) [F10.239] 02/17/2015  . PTSD (post-traumatic stress disorder) [F43.10] 02/17/2015  . Alcohol use disorder, severe, dependence ([place]) [F10.20] 02/16/2015  . Cocaine use disorder, moderate, dependence ([place]) [F14.20] 02/16/2015    Total Time spent with patient: 1 hour  Subjective:   Walter [name] is a 50 y.o. male patient admitted with "I'm hearing voices telling me to drink and do crazy stuff".  HPI:  Patient interviewed. Chart reviewed including previous psychiatric admissions. Labs and vitals reviewed. 50 year old man with a history of mood symptoms and substance abuse came into the emergency room. He says that he has been hearing voices that tell him to drink and do crazy stuff. He also says the voices sometimes tell him to get into fights. This is been going on for a week. At first he says it's only been going on for a week later he clarifies that the voices of actually always been there they've just been worse for the past week. He hasn't been sleeping well for several days. He has been smoking crack cocaine every day and has been  using marijuana and also has been drinking heavily. He has not been getting any psychiatric treatment and has not been on any psychiatric medicine since he was last here in the hospital. He does not have any physical complaints specifically does not complain of any abdominal discomfort.  Social history: Patient says recently he has been living with his brother. Last time he was here he was staying with some other family members. Does not work regularly.  Medical history: History of chronic pancreatitis probably related to alcohol abuse. History of hypertension. Not taking any medicine recently.  Substance abuse history: Recurrent admissions for alcohol abuse. Says he gets shaky with detox denies seizures denies delirium tremens. Chronic use of cocaine and marijuana. Denies that he is using any other drugs recently.  Past Psychiatric History: Patient has been diagnosed in the past variously with major depression and PTSD. Current symptoms seem to be clearly coincide with his relapse into heavy drug abuse. He has been able to maintain himself outside hospitals for months at a time without any medication treatment. I suspect that most of his symptoms are driven by his substance abuse. Nevertheless he says he does have a past history of suicide attempts. Has also been aggressive in the past.  Risk to Self: Is patient at risk for suicide?: Yes Risk to Others:   patient is claiming that he is hearing voices telling him to hurt somebody else. Prior Inpatient Therapy:   positive for prior admissions to the hospital here Prior Outpatient Therapy:   tends to be noncompliant with outpatient treatment.  Past Medical History:  Past Medical History  Diagnosis Date  . Pancreatitis   .  Hypertension   . PTSD (post-traumatic stress disorder)   . Bipolar 1 disorder [facility])     Past Surgical History  Procedure Laterality Date  . Cholecystectomy    . Pancreas surgery     Family History: History reviewed. No  pertinent family history. Family Psychiatric  History: Patient says his sister had mental health problems specifically schizophrenia does not know of any other family history Social History:  History  Alcohol Use  . 7.2 oz/week  . 12 Cans of beer per week    Comment: 1/2 of a 5th a day     History  Drug Use  . Yes  . Special: Cocaine, Marijuana    Comment: uses daily    Social History   Social History  . Marital Status: Widowed    Spouse Name: N/A  . Number of Children: N/A  . Years of Education: N/A   Social History Main Topics  . Smoking status: Current Every Day Smoker -- 0.50 packs/day for 20 years    Types: Cigarettes  . Smokeless tobacco: None  . Alcohol Use: 7.2 oz/week    12 Cans of beer per week     Comment: 1/2 of a 5th a day  . Drug Use: Yes    Special: Cocaine, Marijuana     Comment: uses daily  . Sexual Activity: Yes    Birth Control/ Protection: None   Other Topics Concern  . None   Social History Narrative   Additional Social History:    Allergies:  No Known Allergies  Labs:  Results for orders placed or performed during the hospital encounter of 08/18/15 (from the past 48 hour(s))  Comprehensive metabolic panel     Status: Abnormal   Collection Time: 08/18/15  2:10 PM  Result Value Ref Range   Sodium 137 135 - 145 mmol/L   Potassium 4.1 3.5 - 5.1 mmol/L   Chloride 101 101 - 111 mmol/L   CO2 22 22 - 32 mmol/L   Glucose, Bld 87 65 - 99 mg/dL   BUN 14 6 - 20 mg/dL   Creatinine, Ser 1.52 (H) 0.61 - 1.24 mg/dL   Calcium 9.0 8.9 - 10.3 mg/dL   Total Protein 7.7 6.5 - 8.1 g/dL   Albumin 4.3 3.5 - 5.0 g/dL   AST 165 (H) 15 - 41 U/L   ALT 91 (H) 17 - 63 U/L   Alkaline Phosphatase 109 38 - 126 U/L   Total Bilirubin 0.5 0.3 - 1.2 mg/dL   GFR calc non Af Amer 51 (L) >60 mL/min   GFR calc Af Amer 60 (L) >60 mL/min    Comment: (NOTE) The eGFR has been calculated using the CKD EPI equation. This calculation has not been validated in all clinical  situations. eGFR's persistently <60 mL/min signify possible Chronic Kidney Disease.    Anion gap 14 5 - 15  Ethanol     Status: Abnormal   Collection Time: 08/18/15  2:10 PM  Result Value Ref Range   Alcohol, Ethyl (B) 189 (H) <5 mg/dL    Comment:        LOWEST DETECTABLE LIMIT FOR SERUM ALCOHOL IS 5 mg/dL FOR MEDICAL PURPOSES ONLY   CBC with Diff     Status: Abnormal   Collection Time: 08/18/15  2:10 PM  Result Value Ref Range   WBC 5.9 3.8 - 10.6 K/uL   RBC 5.08 4.40 - 5.90 MIL/uL   Hemoglobin 15.9 13.0 - 18.0 g/dL   HCT 46.9 40.0 -  52.0 %   MCV 92.3 80.0 - 100.0 fL   MCH 31.4 26.0 - 34.0 pg   MCHC 34.0 32.0 - 36.0 g/dL   RDW 13.7 11.5 - 14.5 %   Platelets 236 150 - 440 K/uL   Neutrophils Relative % 43 %   Neutro Abs 2.5 1.4 - 6.5 K/uL   Lymphocytes Relative 35 %   Lymphs Abs 2.1 1.0 - 3.6 K/uL   Monocytes Relative 18 %   Monocytes Absolute 1.1 (H) 0.2 - 1.0 K/uL   Eosinophils Relative 3 %   Eosinophils Absolute 0.2 0 - 0.7 K/uL   Basophils Relative 1 %   Basophils Absolute 0.1 0 - 0.1 K/uL  Salicylate level     Status: None   Collection Time: 08/18/15  2:10 PM  Result Value Ref Range   Salicylate Lvl <3.8 2.8 - 30.0 mg/dL  Acetaminophen level     Status: Abnormal   Collection Time: 08/18/15  2:10 PM  Result Value Ref Range   Acetaminophen (Tylenol), Serum <10 (L) 10 - 30 ug/mL    Comment:        THERAPEUTIC CONCENTRATIONS VARY SIGNIFICANTLY. A RANGE OF 10-30 ug/mL MAY BE AN EFFECTIVE CONCENTRATION FOR MANY PATIENTS. HOWEVER, SOME ARE BEST TREATED AT CONCENTRATIONS OUTSIDE THIS RANGE. ACETAMINOPHEN CONCENTRATIONS >150 ug/mL AT 4 HOURS AFTER INGESTION AND >50 ug/mL AT 12 HOURS AFTER INGESTION ARE OFTEN ASSOCIATED WITH TOXIC REACTIONS.     Current Facility-Administered Medications  Medication Dose Route Frequency Provider Last Rate Last Dose  . haloperidol (HALDOL) tablet 1 mg  1 mg Oral QHS John T Clapacs, MD      . hydrochlorothiazide (HYDRODIURIL)  tablet 25 mg  25 mg Oral Daily John T Clapacs, MD      . lipase/protease/amylase (CREON) capsule 36,000 Units  36,000 Units Oral TID AC John T Clapacs, MD      . LORazepam (ATIVAN) tablet 0-4 mg  0-4 mg Oral 4 times per day Delman Kitten, MD       Followed by  . [START ON 08/20/2015] LORazepam (ATIVAN) tablet 0-4 mg  0-4 mg Oral Q12H Delman Kitten, MD      . mirtazapine (REMERON) tablet 15 mg  15 mg Oral QHS Gonzella Lex, MD      . thiamine (VITAMIN B-1) tablet 100 mg  100 mg Oral Daily Delman Kitten, MD       Or  . thiamine (B-1) injection 100 mg  100 mg Intravenous Daily Delman Kitten, MD       Current Outpatient Prescriptions  Medication Sig Dispense Refill  . FLUoxetine (PROZAC) 10 MG capsule Take 1 capsule (10 mg total) by mouth daily. 7 capsule 0  . haloperidol (HALDOL) 1 MG tablet Take 1 tablet (1 mg total) by mouth at bedtime. 30 tablet 0  . hydrochlorothiazide (HYDRODIURIL) 25 MG tablet Take 1 tablet (25 mg total) by mouth daily. 7 tablet 0  . lipase/protease/amylase (CREON) 36000 UNITS CPEP capsule Take 1 capsule (36,000 Units total) by mouth 3 (three) times daily with meals. 21 capsule 0  . mirtazapine (REMERON) 15 MG tablet Take 1 tablet (15 mg total) by mouth at bedtime. 7 tablet 0    Musculoskeletal: Strength & Muscle Tone: within normal limits Gait & Station: normal Patient leans: N/A  Psychiatric Specialty Exam: Review of Systems  Constitutional: Negative.   HENT: Negative.   Eyes: Negative.   Respiratory: Negative.   Cardiovascular: Negative.   Gastrointestinal: Negative.   Musculoskeletal: Negative.  Skin: Negative.   Neurological: Negative.   Psychiatric/Behavioral: Positive for depression, suicidal ideas, hallucinations and substance abuse. Negative for memory loss. The patient is nervous/anxious and has insomnia.     Blood pressure 141/90, pulse 107, temperature 98 F (36.7 C), temperature source Oral, resp. rate 18, height '5\' 10"'  (1.778 m), weight 79.379 kg (175 lb),  SpO2 95 %.Body mass index is 25.11 kg/(m^2).  General Appearance: Casual  Eye Contact::  Minimal  Speech:  Slow  Volume:  Normal  Mood:  Dysphoric  Affect:  Constricted  Thought Process:  Goal Directed  Orientation:  Full (Time, Place, and Person)  Thought Content:  Hallucinations: Auditory  Suicidal Thoughts:  Yes.  without intent/plan  Homicidal Thoughts:  Yes.  without intent/plan  Memory:  Immediate;   Good Recent;   Fair Remote;   Fair  Judgement:  Fair  Insight:  Fair  Psychomotor Activity:  Decreased  Concentration:  Fair  Recall:  [employer] of Knowledge:Fair  Language: Fair  Akathisia:  No  Handed:  Right  AIMS (if indicated):     Assets:  Desire for Improvement Housing  ADL's:  Intact  Cognition: WNL  Sleep:      Treatment Plan Summary: Daily contact with patient to assess and evaluate symptoms and progress in treatment, Medication management and Plan Patient will be admitted to the psychiatry ward. Orders completed. Continue constant observation for now because of suicidal ideation. Restart previous medications for his mood and psychotic symptoms including Haldol and Remeron although I will only start one antidepressant at this point. For his blood pressure restart hydrochlorothiazide and restart his Creon for pancreatitis. Supportive counseling and encouragement he will be involved in groups downstairs and is also on detox protocol.  Disposition: Recommend psychiatric Inpatient admission when medically cleared. Supportive therapy provided about ongoing stressors.  Alethia Berthold, MD 08/18/2015 4:59 PM

## 2015-08-18 NOTE — ED Notes (Signed)
Pt states he has not taken his medication since October when he ran out for HTN, depression, hallunications.. States over the past 4 days he has been drinking wine/liquor, smoking crack cocaine, mariajuana and has not been able to sleep.. States is began last week hearing voices telling him to drink.. Pt is tearful, states he is having thoughts of harming himself without a plan or intention at this time.;

## 2015-08-18 NOTE — ED Notes (Signed)
Pt given sprite 

## 2015-08-18 NOTE — BH Assessment (Addendum)
Tele Assessment Note   Patient is a 50 year old African American male that reports hearing voices telling him to kill himself.  Patient would not tell me what the voices are telling him to do in order to harm himself.   Documentation in the epic chart reports that the voices are telling him to do crazy stuff.   Patient reports that he is addicted to alcohol.  Patient UDS is positive for cocaine and cannabis.  Patient BAL is 189.  Patient reports previous psychiatric hospitalization for SI and Substance Abuse.  Patient reports that he is not able to remember the dates when he was hospitalized.  Documentation in the epic chart reports that the patient was [facility] with Dr. Ardyth HarpsHernandez.    Patient reports that he has been living with his brother.  Patient denies HI.     Per Dr. Toni Amendlapacs patient meets criteria for inpatient hospitalization at [facility].    Diagnosis: Bipolar Disorder; PTSD; Alcohol Abuse, Severe    Past Medical History:  Past Medical History  Diagnosis Date  . Pancreatitis   . Hypertension   . PTSD (post-traumatic stress disorder)   . Bipolar 1 disorder [facility](HCC)     Past Surgical History  Procedure Laterality Date  . Cholecystectomy    . Pancreas surgery      Family History: History reviewed. No pertinent family history.  Social History:  reports that he has been smoking Cigarettes.  He has a 10 pack-year smoking history. He does not have any smokeless tobacco history on file. He reports that he drinks about 7.2 oz of alcohol per week. He reports that he uses illicit drugs (Cocaine and Marijuana).  Additional Social History:  Alcohol / Drug Use History of alcohol / drug use?: Yes Longest period of sobriety (when/how long): 4-5 Days  Negative Consequences of Use: Financial, Personal relationships, Work / Programmer, multimediachool Withdrawal Symptoms:  (None Reported ) Substance #2 Name of Substance 2: Alcohol 2 - Age of First Use: 50 years old  2 - Amount (size/oz): 5-6 (40oz)  Beer 2 - Frequency: Daily 2 - Duration: Patient reports drinking all of his life 2 - Last Use / Amount: Lat use was today when he drank 4 (40oz) of beer.   CIWA: CIWA-Ar BP: (!) 141/90 mmHg Pulse Rate: (!) 107 COWS:    PATIENT STRENGTHS: (choose at least two) Average or above average intelligence Capable of independent living Communication skills Physical Health Supportive family/friends  Allergies: No Known Allergies  Home Medications:  (Not in a hospital admission)  OB/GYN Status:  No LMP for male patient.  General Assessment Data [facility] Assessment: In system Is this a Tele or Face-to-Face Assessment?: Tele Assessment Is this an Initial Assessment or a Re-assessment for this encounter?: Initial Assessment Marital status: Single Maiden name: NA Is patient pregnant?: No Pregnancy Status: No Living Arrangements:  (Lives with his brother ) Can pt return to current living arrangement?: Yes Admission Status: Voluntary Is patient capable of signing voluntary admission?: Yes Referral Source: Self/Family/Friend Insurance type: Medicare     Crisis Care Plan Living Arrangements:  (Lives with his brother ) Legal Guardian:  (NA) Name of Psychiatrist: None Reported Name of Therapist: None Reported  Education Status Is patient currently in school?: No Current Grade: NA Highest grade of school patient has completed: NA Name of school: NA Contact person: NA  Risk to self with the past 6 months Suicidal Ideation: Yes-Currently Present Has patient been a  risk to self within the past 6 months prior to admission? : Yes Suicidal Intent: Yes-Currently Present Has patient had any suicidal intent within the past 6 months prior to admission? : Yes Is patient at risk for suicide?: Yes Suicidal Plan?: Yes-Currently Present Has patient had any suicidal plan within the past 6 months prior to admission? : Yes Specify Current Suicidal Plan: Patient  reports that voices are telling him to kill himself (Patient refused to tell me how he would kill himself.) Access to Means: Yes Specify Access to Suicidal Means: Patient would not tell me.  What has been your use of drugs/alcohol within the last 12 months?: Alcohol Previous Attempts/Gestures: Yes How many times?: 3 Other Self Harm Risks: None Reported Triggers for Past Attempts: Unpredictable Intentional Self Injurious Behavior: None Family Suicide History: No Recent stressful life event(s): Other (Comment) (Unable to stop drinking drinking ) Persecutory voices/beliefs?: Yes Depression: Yes Depression Symptoms: Despondent, Insomnia, Tearfulness, Fatigue, Loss of interest in usual pleasures, Feeling worthless/self pity, Guilt Substance abuse history and/or treatment for substance abuse?: No Suicide prevention information given to non-admitted patients: Yes  Risk to Others within the past 6 months Homicidal Ideation: No Does patient have any lifetime risk of violence toward others beyond the six months prior to admission? : No Thoughts of Harm to Others: No Current Homicidal Intent: No Current Homicidal Plan: No Access to Homicidal Means: No Identified Victim: None Reported Assessment of Violence: None Noted Violent Behavior Description: None  Does patient have access to weapons?: No Criminal Charges Pending?: No Does patient have a court date: No Is patient on probation?: No  Psychosis Hallucinations: Auditory (Hearing voices telling him to kill himself. ) Delusions: None noted  Mental Status Report Appearance/Hygiene: Disheveled Eye Contact: Fair Motor Activity: Freedom of movement Speech: Logical/coherent Level of Consciousness: Alert Mood: Depressed, Anxious Affect: Blunted, Depressed Anxiety Level: Minimal Judgement: Unimpaired Orientation: Person, Place, Time, Situation Obsessive Compulsive Thoughts/Behaviors: None  Cognitive Functioning Concentration:  Decreased Memory: Recent Intact, Remote Intact IQ: Average Insight: Fair Impulse Control: Fair Appetite: Fair Weight Loss: 0 Weight Gain: 0 Sleep: No Change Total Hours of Sleep:  (Patinent has not been to sleep for 4 days) Vegetative Symptoms: Decreased grooming, Not bathing, Staying in bed  ADLScreening [facility] Assessment Services) Patient's cognitive ability adequate to safely complete daily activities?: Yes Patient able to express need for assistance with ADLs?: Yes Independently performs ADLs?: Yes (appropriate for developmental age)  Prior Inpatient Therapy Prior Inpatient Therapy: Yes Prior Therapy Dates: Unable to remember the dates Prior Therapy Facilty/Provider(s): Unable to remember the name Reason for Treatment: Detox and SI  Prior Outpatient Therapy Prior Outpatient Therapy: No Prior Therapy Dates: NA Prior Therapy Facilty/Provider(s): NA Reason for Treatment: NA Does patient have an ACCT team?: No Does patient have Intensive In-House Services?  : No Does patient have [facility] services? : No Does patient have P4CC services?: No  ADL Screening (condition at time of admission) Patient's cognitive ability adequate to safely complete daily activities?: Yes Patient able to express need for assistance with ADLs?: Yes Independently performs ADLs?: Yes (appropriate for developmental age)       Abuse/Neglect Assessment (Assessment to be complete while patient is alone) Physical Abuse: Denies Verbal Abuse: Denies Sexual Abuse: Denies Exploitation of patient/patient's resources: Denies Self-Neglect: Denies Values / Beliefs Cultural Requests During Hospitalization: None Spiritual Requests During Hospitalization: None Consults Spiritual Care Consult Needed: No Social Work Consult Needed: No Merchant navy officer (For Healthcare) Does patient have an advance directive?: No Would  patient like information on creating an advanced directive?: No - patient declined  information    Additional Information 1:1 In Past 12 Months?: No CIRT Risk: No Elopement Risk: No Does patient have medical clearance?: Yes     Disposition: Inpt per Dr. Toni Amend at [facility].  Disposition Initial Assessment Completed for this Encounter: Yes Disposition of Patient: Inpatient treatment program Type of inpatient treatment program: Adult (Per Dr. Sandria Senter, patient meets criteria for inpatient hospita)  Phillip Heal LaVerne 08/18/2015 5:43 PM

## 2015-08-18 NOTE — ED Notes (Signed)
Pt given supper tray.

## 2015-08-18 NOTE — ED Notes (Signed)
Pt ambulating to BHU at this time with ED tech and ODS officer at this time. Pt calm and cooperative at this time, NAD.

## 2015-08-18 NOTE — Progress Notes (Signed)
LCSW faxed over  Patient assignment sheet and Dr Toni Amendlapacs notes for BMU charge nurse ( doing medications and orientation) Awaiting a call back for room number assignment. Delta Air LinesClaudine Deontrae Drinkard LCSW [phone]

## 2015-08-18 NOTE — ED Notes (Signed)
Pt given sandwich tray and sprite. Pt informed I need urine asap.

## 2015-08-18 NOTE — ED Notes (Signed)
Pt is alert and oriented. Calm and pleasant. Reports that he has been on a "binge" for several days. Is tired. Cold beverage requested and given.

## 2015-08-18 NOTE — ED Notes (Signed)
Pt has 3 belonging bags in storage.

## 2015-08-18 NOTE — ED Provider Notes (Signed)
[facility] Emergency Department Provider Note  ____________________________________________  Time seen: Approximately 3:50 PM  I have reviewed the triage vital signs and the nursing notes.   HISTORY  Chief Complaint Hallucinations and Drug Problem    HPI Walter [name] is a 50 y.o. male reports that he's been hearing voices telling him to hurt others as well as drink and use drugs. Reports he's been drinking fairly heavily up to 200 ounces of beer daily as well as using cocaine for about the last 5 days.  Denies being any pain, states he feels like he needs detox. He has previously used drugs in the past, and does have a history of psychiatric disorder including bipolar and states he has not been on a medicine since about October.  Denies suicidal ideation but does state that he would essentially hurt others and voices are telling him to hurt others.   Past Medical History  Diagnosis Date  . Pancreatitis   . Hypertension   . PTSD (post-traumatic stress disorder)   . Bipolar 1 disorder [facility])     Patient Active Problem List   Diagnosis Date Noted  . Substance induced mood disorder (HCC) 08/18/2015  . Depression 02/24/2015  . Major depressive disorder, recurrent, severe without psychotic features (HCC)   . Severe major depression without psychotic features (HCC) 02/19/2015  . Tobacco use disorder 02/17/2015  . Pancreatitis, chronic (HCC) 02/17/2015  . HTN (hypertension) 02/17/2015  . Alcohol withdrawal (HCC) 02/17/2015  . PTSD (post-traumatic stress disorder) 02/17/2015  . Alcohol use disorder, severe, dependence (HCC) 02/16/2015  . Cocaine use disorder, moderate, dependence (HCC) 02/16/2015    Past Surgical History  Procedure Laterality Date  . Cholecystectomy    . Pancreas surgery      Current Outpatient Rx  Name  Route  Sig  Dispense  Refill  . FLUoxetine (PROZAC) 10 MG capsule   Oral   Take 1 capsule (10 mg total) by mouth daily.   7  capsule   0   . haloperidol (HALDOL) 1 MG tablet   Oral   Take 1 tablet (1 mg total) by mouth at bedtime.   30 tablet   0   . hydrochlorothiazide (HYDRODIURIL) 25 MG tablet   Oral   Take 1 tablet (25 mg total) by mouth daily.   7 tablet   0   . lipase/protease/amylase (CREON) 36000 UNITS CPEP capsule   Oral   Take 1 capsule (36,000 Units total) by mouth 3 (three) times daily with meals.   21 capsule   0   . mirtazapine (REMERON) 15 MG tablet   Oral   Take 1 tablet (15 mg total) by mouth at bedtime.   7 tablet   0     Allergies Review of patient's allergies indicates no known allergies.  History reviewed. No pertinent family history.  Social History Social History  Substance Use Topics  . Smoking status: Current Every Day Smoker -- 0.50 packs/day for 20 years    Types: Cigarettes  . Smokeless tobacco: None  . Alcohol Use: 7.2 oz/week    12 Cans of beer per week     Comment: 1/2 of a 5th a day    Review of Systems Constitutional: No fever/chills Eyes: No visual changes. ENT: No sore throat. Cardiovascular: Denies chest pain. Respiratory: Denies shortness of breath. Gastrointestinal: No abdominal pain.  No nausea, no vomiting.  No diarrhea.  No constipation. Genitourinary: Negative for dysuria. Musculoskeletal: Negative for back pain. Skin: Negative  for rash. Neurological: Negative for headaches, focal weakness or numbness.  10-point ROS otherwise negative.  ____________________________________________   PHYSICAL EXAM:  VITAL SIGNS: ED Triage Vitals  Enc Vitals Group     BP 08/18/15 1407 141/90 mmHg     Pulse Rate 08/18/15 1407 107     Resp 08/18/15 1407 18     Temp 08/18/15 1407 98 F (36.7 C)     Temp Source 08/18/15 1407 Oral     SpO2 08/18/15 1407 95 %     Weight 08/18/15 1407 175 lb (79.379 kg)     Height 08/18/15 1407  (1.778 m)     Head Cir --      Peak Flow --      Pain Score 08/18/15 1408 6     Pain Loc --      Pain Edu? --       Excl. in GC? --    Constitutional: Alert and oriented. Well appearing and in no acute distress. Eyes: Conjunctivae are normal. PERRL. EOMI. Head: Atraumatic. Nose: No congestion/rhinnorhea. Mouth/Throat: Mucous membranes are moist.  Oropharynx non-erythematous. Neck: No stridor.   Cardiovascular: Normal rate, regular rhythm. Grossly normal heart sounds.  Good peripheral circulation. Respiratory: Normal respiratory effort.  No retractions. Lungs CTAB. Gastrointestinal: Soft and nontender. No distention. No abdominal bruits. No CVA tenderness. Musculoskeletal: No lower extremity tenderness nor edema.  No joint effusions. Neurologic:  Normal speech and language. No gross focal neurologic deficits are appreciated. Skin:  Skin is warm, dry and intact. No rash noted. Psychiatric: Mood and affect are calm and rather flat. Speech and behavior are normal.  ____________________________________________   LABS (all labs ordered are listed, but only abnormal results are displayed)  Labs Reviewed  COMPREHENSIVE METABOLIC PANEL - Abnormal; Notable for the following:    Creatinine, Ser 1.52 (*)    AST 165 (*)    ALT 91 (*)    GFR calc non Af Amer 51 (*)    GFR calc Af Amer 60 (*)    All other components within normal limits  ETHANOL - Abnormal; Notable for the following:    Alcohol, Ethyl (B) 189 (*)    All other components within normal limits  CBC WITH DIFFERENTIAL/PLATELET - Abnormal; Notable for the following:    Monocytes Absolute 1.1 (*)    All other components within normal limits  ACETAMINOPHEN LEVEL - Abnormal; Notable for the following:    Acetaminophen (Tylenol), Serum <10 (*)    All other components within normal limits  URINE DRUG SCREEN, QUALITATIVE ([facility] ONLY) - Abnormal; Notable for the following:    Cocaine Metabolite,Ur [place] POSITIVE (*)    Cannabinoid 50 Ng, Ur [place] POSITIVE (*)    All other components within normal limits  SALICYLATE LEVEL    ____________________________________________  EKG  ____________________________________________  RADIOLOGY   ____________________________________________   PROCEDURES  Procedure(s) performed: None  Critical Care performed: No  ____________________________________________   INITIAL IMPRESSION / ASSESSMENT AND PLAN / ED COURSE  Pertinent labs & imaging results that were available during my care of the patient were reviewed by me and considered in my medical decision making (see chart for details).  Patient presents for evaluation of hallucinations occurring in the setting of increased alcohol and drug use. He currently does not demonstrate any signs of acute withdrawal, but does endorse auditory command hallucinations for which I placed on involuntary commitment. I will order a psychiatric consultation.  ----------------------------------------- 7:22 PM on 08/18/2015 -----------------------------------------  Patient seen by  Dr. Toni Amendlapacs, plans to admit to psychiatry. The patient medically screened for and not found to have any clear emergent medical diagnosis, but will continue on involuntary commitment and be admitted to psychiatry team. ____________________________________________   FINAL CLINICAL IMPRESSION(S) / ED DIAGNOSES  Final diagnoses:  Auditory hallucination  Polysubstance abuse      Sharyn CreamerMark Quale, MD 08/18/15 1924

## 2015-08-18 NOTE — ED Notes (Addendum)
Pt on TTS consult at this time. Pt calm and cooperative, NAD noted at this time.

## 2015-08-18 NOTE — ED Notes (Signed)
States he is looking for detox from drugs and ETOh, states he used crack today and drank, states he is hearing voices telling him to drink and hurt himself

## 2015-08-18 NOTE — ED Notes (Signed)
Pt dressed out, pt clothes including puffy vest and shoes placed in bags

## 2015-08-19 ENCOUNTER — Inpatient Hospital Stay
Admit: 2015-08-19 | Discharge: 2015-08-21 | DRG: 897 | Disposition: A | Discharge status: Rehabilitation Facility | Payer: Medicare Other | Source: Ambulatory Visit | Attending: Psychiatry | Admitting: Psychiatry

## 2015-08-19 DIAGNOSIS — F4310 Post-traumatic stress disorder, unspecified: Secondary | ICD-10-CM | POA: Diagnosis present

## 2015-08-19 DIAGNOSIS — I10 Essential (primary) hypertension: Secondary | ICD-10-CM | POA: Diagnosis present

## 2015-08-19 DIAGNOSIS — R45851 Suicidal ideations: Secondary | ICD-10-CM | POA: Diagnosis present

## 2015-08-19 DIAGNOSIS — F1220 Cannabis dependence, uncomplicated: Secondary | ICD-10-CM | POA: Diagnosis present

## 2015-08-19 DIAGNOSIS — Z9049 Acquired absence of other specified parts of digestive tract: Secondary | ICD-10-CM | POA: Diagnosis not present

## 2015-08-19 DIAGNOSIS — F1994 Other psychoactive substance use, unspecified with psychoactive substance-induced mood disorder: Principal | ICD-10-CM

## 2015-08-19 DIAGNOSIS — R440 Auditory hallucinations: Secondary | ICD-10-CM | POA: Diagnosis present

## 2015-08-19 DIAGNOSIS — F10239 Alcohol dependence with withdrawal, unspecified: Secondary | ICD-10-CM | POA: Diagnosis present

## 2015-08-19 DIAGNOSIS — K861 Other chronic pancreatitis: Secondary | ICD-10-CM | POA: Diagnosis present

## 2015-08-19 DIAGNOSIS — F17200 Nicotine dependence, unspecified, uncomplicated: Secondary | ICD-10-CM | POA: Diagnosis present

## 2015-08-19 DIAGNOSIS — Z9889 Other specified postprocedural states: Secondary | ICD-10-CM | POA: Diagnosis not present

## 2015-08-19 DIAGNOSIS — F1420 Cocaine dependence, uncomplicated: Secondary | ICD-10-CM | POA: Diagnosis present

## 2015-08-19 DIAGNOSIS — F17210 Nicotine dependence, cigarettes, uncomplicated: Secondary | ICD-10-CM | POA: Diagnosis present

## 2015-08-19 DIAGNOSIS — F19959 Other psychoactive substance use, unspecified with psychoactive substance-induced psychotic disorder, unspecified: Secondary | ICD-10-CM | POA: Diagnosis present

## 2015-08-19 DIAGNOSIS — F1020 Alcohol dependence, uncomplicated: Secondary | ICD-10-CM | POA: Diagnosis present

## 2015-08-19 DIAGNOSIS — Z9119 Patient's noncompliance with other medical treatment and regimen: Secondary | ICD-10-CM | POA: Diagnosis not present

## 2015-08-19 DIAGNOSIS — F10939 Alcohol use, unspecified with withdrawal, unspecified: Secondary | ICD-10-CM | POA: Diagnosis present

## 2015-08-19 LAB — LIPID PANEL
Cholesterol: 165 mg/dL (ref 0–200)
HDL: 105 mg/dL (ref >40)
LDL Cholesterol: 31 mg/dL (ref 0–99)
Total CHOL/HDL Ratio: 1.6 RATIO
Triglycerides: 144 mg/dL (ref <150)
VLDL: 29 mg/dL (ref 0–40)

## 2015-08-19 LAB — TSH: TSH: 3.151 u[IU]/mL (ref 0.350–4.500)

## 2015-08-19 LAB — HEMOGLOBIN A1C: Hgb A1c MFr Bld: 4.7 % (ref 4.0–6.0)

## 2015-08-19 MED ORDER — HYDROCHLOROTHIAZIDE 25 MG PO TABS
25.0000 mg | ORAL_TABLET | Freq: Every day | ORAL | Status: DC
  Administered 2015-08-19 – 2015-08-21 (×3): 25 mg via ORAL
  Filled 2015-08-19 (×2): qty 1

## 2015-08-19 MED ORDER — VITAMIN B-1 100 MG PO TABS
100.0000 mg | ORAL_TABLET | Freq: Every day | ORAL | Status: DC
  Administered 2015-08-19 – 2015-08-21 (×3): 100 mg via ORAL
  Filled 2015-08-19 (×3): qty 1

## 2015-08-19 MED ORDER — MIRTAZAPINE 15 MG PO TABS
15.0000 mg | ORAL_TABLET | Freq: Every day | ORAL | Status: DC
  Administered 2015-08-19 – 2015-08-20 (×2): 15 mg via ORAL
  Filled 2015-08-19 (×2): qty 1

## 2015-08-19 MED ORDER — THIAMINE HCL 100 MG/ML IJ SOLN
100.0000 mg | Freq: Every day | INTRAMUSCULAR | Status: DC
  Filled 2015-08-19: qty 2

## 2015-08-19 MED ORDER — HALOPERIDOL 0.5 MG PO TABS
1.0000 mg | ORAL_TABLET | Freq: Every day | ORAL | Status: DC
  Administered 2015-08-19 – 2015-08-20 (×2): 1 mg via ORAL
  Filled 2015-08-19 (×2): qty 2

## 2015-08-19 MED ORDER — ACETAMINOPHEN 325 MG PO TABS
650.0000 mg | ORAL_TABLET | Freq: Four times a day (QID) | ORAL | Status: DC | PRN
  Administered 2015-08-19 – 2015-08-20 (×2): 650 mg via ORAL
  Filled 2015-08-19 (×2): qty 2

## 2015-08-19 MED ORDER — LORAZEPAM 2 MG PO TABS: 0.0000 mg | ORAL_TABLET | Freq: Two times a day (BID) | ORAL | Status: DC

## 2015-08-19 MED ORDER — CHLORDIAZEPOXIDE HCL 25 MG PO CAPS
25.0000 mg | ORAL_CAPSULE | Freq: Four times a day (QID) | ORAL | Status: DC
  Administered 2015-08-19 – 2015-08-20 (×6): 25 mg via ORAL
  Filled 2015-08-19 (×6): qty 1

## 2015-08-19 MED ORDER — ALUM & MAG HYDROXIDE-SIMETH 200-200-20 MG/5ML PO SUSP: 30.0000 mL | ORAL | Status: DC | PRN

## 2015-08-19 MED ORDER — LORAZEPAM 2 MG PO TABS: 0.0000 mg | ORAL_TABLET | Freq: Four times a day (QID) | ORAL | Status: DC

## 2015-08-19 MED ORDER — PANCRELIPASE (LIP-PROT-AMYL) 12000-38000 UNITS PO CPEP
36000.0000 [IU] | ORAL_CAPSULE | Freq: Three times a day (TID) | ORAL | Status: DC
  Administered 2015-08-19 – 2015-08-21 (×8): 36000 [IU] via ORAL
  Filled 2015-08-19 (×13): qty 3

## 2015-08-19 MED ORDER — MAGNESIUM HYDROXIDE 400 MG/5ML PO SUSP: 30.0000 mL | Freq: Every day | ORAL | Status: DC | PRN

## 2015-08-19 NOTE — ED Notes (Signed)
Calm and cooperative. Understands that he is being admitted to BMU. Has been sleeping, but woke up to use the bathroom.

## 2015-08-19 NOTE — Tx Team (Signed)
Initial Interdisciplinary Treatment Plan   PATIENT STRESSORS: Health problems Medication change or noncompliance Substance abuse   PATIENT STRENGTHS: Average or above average intelligence Communication skills Supportive family/friends   PROBLEM LIST: Problem List/Patient Goals Date to be addressed Date deferred Reason deferred Estimated date of resolution  Substance abuse 08/19/2015     Non compliance 08/19/2015     Hallucinations 08/19/2015     Suicidal Thoughts 08/19/2015                                    DISCHARGE CRITERIA:  Ability to meet basic life and health needs Improved stabilization in mood, thinking, and/or behavior  PRELIMINARY DISCHARGE PLAN: Return to previous living arrangement  PATIENT/FAMIILY INVOLVEMENT: This treatment plan has been presented to and reviewed with the patient, Walter [name].  The patient and family have been given the opportunity to ask questions and make suggestions.  Polly CobiaSherrie Y Clarise Chacko 08/19/2015, 3:12 AM

## 2015-08-19 NOTE — Progress Notes (Signed)
D: Patient stated slept poor last night .Stated appetite is fair and energy level  Iow Stated concentration is good . Stated on Depression scale 8  , hopeless 8 and anxiety 7 .( low 0-10 high) Denies suicidal  homicidal ideations  .  No auditory hallucinations   pain concerns with stomach  But has not requested medication  . Appropriate ADL'S. Interacting with peers and staff. Voice of symptoms of withdrawal .  With cramping  ,running nose tremors  A: Encourage patient participation with unit programming . Instruction  Given on  Medication , verbalize understanding. R: Voice no other concerns. Staff continue to monitor

## 2015-08-19 NOTE — H&P (Signed)
Psychiatric Admission Assessment Adult  Patient Identification: Walter [name] MRN:  [id number] Date of Evaluation:  08/19/2015 Chief Complaint:  Substance induced psychosis Principal Diagnosis: Substance induced mood disorder (HCC) Diagnosis:   Patient Active Problem List   Diagnosis Date Noted  . Cannabis use disorder, moderate, dependence (HCC) [F12.20] 08/19/2015  . Substance induced mood disorder (HCC) [F19.94] 08/18/2015  . Tobacco use disorder [F17.200] 02/17/2015  . Pancreatitis, chronic (HCC) [K86.1] 02/17/2015  . HTN (hypertension) [I10] 02/17/2015  . Alcohol withdrawal (HCC) [F10.239] 02/17/2015  . PTSD (post-traumatic stress disorder) [F43.10] 02/17/2015  . Alcohol use disorder, severe, dependence (HCC) [F10.20] 02/16/2015  . Cocaine use disorder, moderate, dependence (HCC) [F14.20] 02/16/2015   History of Present Illness:  Identifying data. Walter [name] is a 50 year old male with a history of depression, mood instability, and substance use.  Chief complaint. "I have voices telling me to kill myself."  History of present illness. Information was obtained from the patient and the chart the patient has a long history of substance and mood instability. He has been hospitalized numerous times for detox and was admitted at least twice for Rehabilitation. There is a long history of treatment noncompliance. [facility] he did not follow-up with mental health professionals no medications. He returns to the hospital complaining of severe depression and appetite anhedonia, feeling of guilt and hopelessness worthlessness, poor energy and concentration, social isolation, crying cells suicidal ideation with a plan to walk into traffic and auditory command hallucinations. This is in the context of heavy alcohol cocaine and cannabis use. The patient denies symptoms suggestive of bipolar mania. He is unable to tell me how much she drinks.  Reportedly it is liquor and as much as he can get. He has also used cocaine heavily lately. He came to the hospital asking for detox and complaining of suicidal ideation and auditory command hallucinations.  Past psychiatric history. Several prior hospitalization for detox. [place] for 6 or 7 months. He relapsed immediately following discharge. He was to a dark before. He does not feel he benefited. He reported several suicide attempts or aborted suicide traffic in drinking antifreeze. In the past he has been maintained on a combination of Haldol, Prozac, and Remeron but has not been taking any medications lately. At some point she was diagnosed with PTSD when he found his wife who committed suicide.   Family psychiatric history. He denies any.  Social history. He graduated from high school. He used to work at [employer]. And his wife committed suicide. He is disabled from chronic pancreatitis. He has Medicare. He has been staying with different family members most recently with his brother.   Total Time spent with patient: 1 hour  Past Psychiatric Histdepression, mood instability, substance use.  Is the patient at risk to self? Yes.    Has the patient been a risk to self in the past 6 months? No.  Has the patient been a risk to self within the distant past? Yes.    Is the patient a risk to others? No.  Has the patient been a risk to others in the past 6 months? No.  Has the patient been a risk to others within the distant past? No.   Prior Inpatient Therapy:   Prior Outpatient Therapy:    Alcohol Screening: 1. How often do you have a drink containing alcohol?: 4 or more times a week 2. How many drinks  containing alcohol do you have on a typical day when you are drinking?: 10 or more 3. How often do you have six or more drinks on one occasion?: Daily or almost daily Preliminary Score: 8 4. How often during the last year have you found that  you were not able to stop drinking once you had started?: Daily or almost daily 5. How often during the last year have you failed to do what was normally expected from you becasue of drinking?: Never 6. How often during the last year have you needed a first drink in the morning to get yourself going after a heavy drinking session?: Never 7. How often during the last year have you had a feeling of guilt of remorse after drinking?: Never 8. How often during the last year have you been unable to remember what happened the night before because you had been drinking?: Never 9. Have you or someone else been injured as a result of your drinking?: No 10. Has a relative or friend or a doctor or another health worker been concerned about your drinking or suggested you cut down?: Yes, during the last year Alcohol Use Disorder Identification Test Final Score (AUDIT): 20 Brief Intervention: Patient declined brief intervention (Patient states does not want to stop drinking) Substance Abuse History in the last 12 months:  Yes.   Consequences of Substance Abuse: Negative Previous Psychotropic Medications: Yes  Psychological Evaluations: No  Past Medical History:  Past Medical History  Diagnosis Date  . Pancreatitis   . Hypertension   . PTSD (post-traumatic stress disorder)   . Bipolar 1 disorder [facility])     Past Surgical History  Procedure Laterality Date  . Cholecystectomy    . Pancreas surgery     Family History: History reviewed. No pertinent family history. Family Psychiatric  Hisnone reported.  Tobacco Screening: @FLOW [phone])::1)@ Social History:  History  Alcohol Use  . 7.2 oz/week  . 12 Cans of beer per week    Comment: 1/2 of a 5th a day     History  Drug Use  . Yes  . Special: Cocaine, Marijuana    Comment: uses daily    Additional Social History:                           Allergies:  No Known Allergies Lab Results:  Results for orders placed or performed  during the hospital encounter of 08/19/15 (from the past 48 hour(s))  Lipid panel, fasting     Status: None   Collection Time: 08/19/15  7:33 AM  Result Value Ref Range   Cholesterol 165 0 - 200 mg/dL   Triglycerides 161 <096 mg/dL   HDL 045 >40 mg/dL   Total CHOL/HDL Ratio 1.6 RATIO   VLDL 29 0 - 40 mg/dL   LDL Cholesterol 31 0 - 99 mg/dL    Comment:        Total Cholesterol/HDL:CHD Risk Coronary Heart Disease Risk Table                     Men   Women  1/2 Average Risk   3.4   3.3  Average Risk       5.0   4.4  2 X Average Risk   9.6   7.1  3 X Average Risk  23.4   11.0        Use the calculated Patient Ratio above and the CHD Risk Table to  determine the patient's CHD Risk.        ATP III CLASSIFICATION (LDL):  <100     mg/dL   Optimal  629-528  mg/dL   Near or Above                    Optimal  130-159  mg/dL   Borderline  413-244  mg/dL   High  >010     mg/dL   Very High   TSH     Status: None   Collection Time: 08/19/15  7:33 AM  Result Value Ref Range   TSH 3.151 0.350 - 4.500 uIU/mL    Blood Alcohol level:  Lab Results  Component Value Date   ETH 189* 08/18/2015   ETH 233* 02/15/2015    Metabolic Disorder Labs:  No results found for: HGBA1C, MPG No results found for: PROLACTIN Lab Results  Component Value Date   CHOL 165 08/19/2015   TRIG 144 08/19/2015   HDL 105 08/19/2015   CHOLHDL 1.6 08/19/2015   VLDL 29 08/19/2015   LDLCALC 31 08/19/2015   LDLCALC 30 04/06/2013    Current Medications: Current Facility-Administered Medications  Medication Dose Route Frequency Provider Last Rate Last Dose  . acetaminophen (TYLENOL) tablet 650 mg  650 mg Oral Q6H PRN Audery Amel, MD      . alum & mag hydroxide-simeth (MAALOX/MYLANTA) 200-200-20 MG/5ML suspension 30 mL  30 mL Oral Q4H PRN Audery Amel, MD      . chlordiazePOXIDE (LIBRIUM) capsule 25 mg  25 mg Oral QID Lucienne Sawyers B Minnette Merida, MD   25 mg at 08/19/15 1001  . haloperidol (HALDOL) tablet 1 mg  1 mg  Oral QHS Audery Amel, MD      . hydrochlorothiazide (HYDRODIURIL) tablet 25 mg  25 mg Oral Daily Audery Amel, MD   25 mg at 08/19/15 0853  . lipase/protease/amylase (CREON) capsule 36,000 Units  36,000 Units Oral TID AC Audery Amel, MD   36,000 Units at 08/19/15 [id number]  . magnesium hydroxide (MILK OF MAGNESIA) suspension 30 mL  30 mL Oral Daily PRN Audery Amel, MD      . mirtazapine (REMERON) tablet 15 mg  15 mg Oral QHS Audery Amel, MD      . thiamine (VITAMIN B-1) tablet 100 mg  100 mg Oral Daily Audery Amel, MD   100 mg at 08/19/15 3664   PTA Medications: Prescriptions prior to admission  Medication Sig Dispense Refill Last Dose  . FLUoxetine (PROZAC) 10 MG capsule Take 1 capsule (10 mg total) by mouth daily. (Patient not taking: Reported on 08/19/2015) 7 capsule 0 Not Taking at Unknown time  . haloperidol (HALDOL) 1 MG tablet Take 1 tablet (1 mg total) by mouth at bedtime. (Patient not taking: Reported on 08/19/2015) 30 tablet 0 Not Taking at Unknown time  . hydrochlorothiazide (HYDRODIURIL) 25 MG tablet Take 1 tablet (25 mg total) by mouth daily. (Patient not taking: Reported on 08/19/2015) 7 tablet 0 Not Taking at Unknown time  . lipase/protease/amylase (CREON) 36000 UNITS CPEP capsule Take 1 capsule (36,000 Units total) by mouth 3 (three) times daily with meals. (Patient not taking: Reported on 08/19/2015) 21 capsule 0 Not Taking at Unknown time  . mirtazapine (REMERON) 15 MG tablet Take 1 tablet (15 mg total) by mouth at bedtime. (Patient not taking: Reported on 08/19/2015) 7 tablet 0 Not Taking at Unknown time    Musculoskeletal: Strength & Muscle Tone: within normal limits  Gait & Station: normal Patient leans: N/A  Psychiatric Specialty Exam: I reviewed physical exam performed in the emergency room and agree with the findings Physical Exam  Nursing note and vitals reviewed.   Review of Systems  Constitutional: Positive for malaise/fatigue.  Psychiatric/Behavioral: Positive  for depression, suicidal ideas and substance abuse.  All other systems reviewed and are negative.   Blood pressure 132/99, pulse 109, temperature 98.3 F (36.8 C), temperature source Oral, resp. rate 18, height  (1.778 m), weight 79.379 kg (175 lb), SpO2 100 %.Body mass index is 25.11 kg/(m^2).  See SRA.                                                  Sleep:  Number of Hours: 4     Treatment Plan Summary: Daily contact with patient to assess and evaluate symptoms and progress in treatment and Medication management   Walter [name] is a 50 year old male with a history of mood instability and substance use admitted for suicidal ideation and hallucinations in the context of heavy substance use.  1. Suicidal ideation. The patient is able to contract for safety in the hospital.  2. Mood and psychosis. He was restarted on Remeron and Haldol for depression and hallucinations. He has not been compliant with treatment in the community.   3. Alcohol abuse. He is on Librium taper.  4. Substance abuse treatment. The patient was positive for alcohol, cannabis, cocaine. He is in no shape to discuss his treatment today.  5. Hypertension. She is on hydrochlorothiazide.  6. Chronic pancreatitis. He is on Creon.   7. Smoking. Nicotine patch is available.  8 disposition. He will be discharged to home with his brother. He will follow up with [facility] for medication management [facility].     Observation Level/Precautions:  15 minute checks  Laboratory:  CBC Chemistry Profile UDS UA  Psychotherapy:    Medications:    Consultations:    Discharge Concerns:    Estimated LOS:  Other:     I certify that inpatient services furnished can reasonably be expected to improve the patient's condition.    Kristine Linea, MD 3/7/201711:29 AM

## 2015-08-19 NOTE — BHH Group Notes (Signed)
[facility] LCSW Group Therapy   08/19/2015 11am  Type of Therapy: Group Therapy   Participation Level: Did Not Attend. Patient invited to participate but declined.    Dorothe PeaJonathan F. Lucille Crichlow, MSW, LCSWA, LCAS

## 2015-08-19 NOTE — [facility] Group Notes (Signed)
[facility] Group Notes:  (Nursing/MHT/Case Management/Adjunct)  Date:  08/19/2015  Time:  2:17 PM  Type of Therapy:  Psychoeducational Skills  Participation Level:  Did Not Attend   Walter [name] 08/19/2015, 2:17 PM 

## 2015-08-19 NOTE — ED Notes (Signed)
Pt is alert and oriented, calm and cooperative. He is aware that he is being admitted to the behavioral health unit.

## 2015-08-19 NOTE — Progress Notes (Signed)
Recreation Therapy Notes  Date: 03.07.17 Time: 3:00 pm Location: Craft Room  Group Topic: Self-expression  Goal Area(s) Addresses:  Patient will identify one color per emotion listed on wheel. Patient will verbalize benefit of using art as a means of self-expression. Patient will verbalize one emotion experienced during session. Patient will be educated on other forms of self-expression.  Behavioral Response: Did not attend  Intervention: Emotion Wheel  Activity: Patients were given an Emotion Wheel worksheet with 7 different emotions and were instructed to pick a color for each emotion.  Education: Patient did not attend group.  Education Outcome: Patient did not attend group.   Clinical Observations/Feedback: Patient did not attend group.  Jacquelynn CreeGreene,Johathan Province M, LRT/CTRS 08/19/2015 4:08 PM

## 2015-08-19 NOTE — Progress Notes (Signed)
D: Patient admitted to unit . Patient admitted for detox, hallucinations, and passive SI . Patient currently denies SI/HI.  A: Patient searched with no contraband found, skin assessment done, Very  Dry skin on bilateral feet. Patient oriented to unit. Informed by nurse to come to station for any concerns . R: Patient verbalized understanding , voice no other concerns .

## 2015-08-19 NOTE — Plan of Care (Signed)
Problem: Ineffective individual coping Goal: LTG: Patient will report a decrease in negative feelings Outcome: Not Progressing Limited  Interaction , no unit programing

## 2015-08-19 NOTE — Tx Team (Signed)
Interdisciplinary Treatment Plan Update (Adult)  Date:  08/19/2015 Time Reviewed:  5:11 PM  Progress in Treatment: Attending groups: No. Participating in groups:  No. Taking medication as prescribed:  No. Tolerating medication:  No. Family/Significant othe contact made:  No, will contact:    Patient understands diagnosis:  Yes. Discussing patient identified problems/goals with staff:  No. Medical problems stabilized or resolved:  No. Denies suicidal/homicidal ideation: No. Issues/concerns per patient self-inventory:  No. Other:  New problem(s) identified: No, Describe:     Discharge Plan or Barriers:  Discharge home to family or shelter, follow up likely with [facility].  Reason for Continuation of Hospitalization: Depression Command Auditory Hallucinations Suicidal Ideation  Comments:Mr. Clayton is a 50 year old male with a history of depression, mood instability, and substance use.  Estimated length of stay: 3-5 days  New goal(s):  Review of initial/current patient goals per problem list:   1.  Goal(s):Pt will participate in aftercare plan.  Met:  No  Target date:discharge  As evidenced YM:EBRA for housing and psychiatric follow up being identified  2.  Goal (s):Pt will exhibit decreased depressive symptoms and suicidal ideation.  Met:  No  Target date:discharge  As evidenced by:Pt will utilize self-rating of depression at 3 or below and demonstrate decreased signs of depression OR be deemed stable by MD.  3.  Goal(s):Patient will demonstrate decreased signs of withdrawal due to substance abuse.  Met:  No  Target date:discharge  As evidenced XE:NMMHWKG will produce a CIWA/COWS score of 0, have stable vital signs, and no symptoms of withdrawal  4.  Goal(s):Pt will demonstrate decrease in psychotic symptoms.  Met:  No   Target date:discharge  As evidenced [id number] in signs and symptoms of psychosis OR Pt is deemed stable or at baseline by  MD.  Attendees: Patient:  Walter [name] 3/7/20175:11 PM  Family:   3/7/20175:11 PM  Physician:  Bary Leriche 3/7/20175:11 PM  Nursing:   Polly Cobia, RN 3/7/20175:11 PM  Case Manager:   3/7/20175:11 PM  Counselor:  Dossie Arbour, LCSW 3/7/20175:11 PM  Other:  Everitt Amber, LRT 3/7/20175:11 PM  Other:   3/7/20175:11 PM  Other:   3/7/20175:11 PM  Other:  3/7/20175:11 PM  Other:  3/7/20175:11 PM  Other:  3/7/20175:11 PM  Other:  3/7/20175:11 PM  Other:  3/7/20175:11 PM  Other:  3/7/20175:11 PM  Other:   3/7/20175:11 PM   Scribe for Treatment Team:   August Saucer, 08/19/2015, 5:11 PM, MSW, LCSW

## 2015-08-19 NOTE — [facility] Suicide Risk Assessment (Signed)
[facility] Admission Suicide Risk Assessment   Nursing information obtained from:  Patient, Review of record Demographic factors:  Male, Low socioeconomic status Current Mental Status:  NA Loss Factors:  NA Historical Factors:  Family history of mental illness or substance abuse Risk Reduction Factors:  Living with another person, especially a relative  Total Time spent with patient: 1 hour Principal Problem: Substance induced mood disorder (HCC) Diagnosis:   Patient Active Problem List   Diagnosis Date Noted  . Cannabis use disorder, moderate, dependence (HCC) [F12.20] 08/19/2015  . Substance induced mood disorder (HCC) [F19.94] 08/18/2015  . Tobacco use disorder [F17.200] 02/17/2015  . Pancreatitis, chronic (HCC) [K86.1] 02/17/2015  . HTN (hypertension) [I10] 02/17/2015  . Alcohol withdrawal (HCC) [F10.239] 02/17/2015  . PTSD (post-traumatic stress disorder) [F43.10] 02/17/2015  . Alcohol use disorder, severe, dependence (HCC) [F10.20] 02/16/2015  . Cocaine use disorder, moderate, dependence (HCC) [F14.20] 02/16/2015   Subjective Data: Depression, suicidal ideation, hallucinations, substance use.  Continued Clinical Symptoms:  Alcohol Use Disorder Identification Test Final Score (AUDIT): 20 The "Alcohol Use Disorders Identification Test", Guidelines for Use in Primary Care, Second Edition.  World Science writer [facility]). Score between 0-7:  no or low risk or alcohol related problems. Score between 8-15:  moderate risk of alcohol related problems. Score between 16-19:  high risk of alcohol related problems. Score 20 or above:  warrants further diagnostic evaluation for alcohol dependence and treatment.   CLINICAL FACTORS:   Depression:   Comorbid alcohol abuse/dependence Impulsivity Alcohol/Substance Abuse/Dependencies   Musculoskeletal: Strength & Muscle Tone: within normal limits Gait & Station: normal Patient leans: N/A  Psychiatric Specialty Exam: Review of Systems   Constitutional: Positive for malaise/fatigue.  Psychiatric/Behavioral: Positive for suicidal ideas, hallucinations and substance abuse.  All other systems reviewed and are negative.   Blood pressure 132/99, pulse 109, temperature 98.3 F (36.8 C), temperature source Oral, resp. rate 18, height  (1.778 m), weight 79.379 kg (175 lb), SpO2 100 %.Body mass index is 25.11 kg/(m^2).  General Appearance: Disheveled  Eye Contact::  Poor  Speech:  Slow  Volume:  Decreased  Mood:  Dysphoric  Affect:  Blunt  Thought Process:  Linear  Orientation:  Full (Time, Place, and Person)  Thought Content:  Hallucinations: Auditory  Suicidal Thoughts:  Yes.  with intent/plan  Homicidal Thoughts:  No  Memory:  Immediate;   Fair Recent;   Fair Remote;   Fair  Judgement:  Poor  Insight:  Lacking  Psychomotor Activity:  Decreased  Concentration:  Fair  Recall:  Poor  Fund of Knowledge:Fair  Language: Fair  Akathisia:  No  Handed:  Right  AIMS (if indicated):     Assets:  Architect Resilience  Sleep:  Number of Hours: 4  Cognition: WNL  ADL's:  Intact    COGNITIVE FEATURES THAT CONTRIBUTE TO RISK:  None    SUICIDE RISK:   Moderate:  Frequent suicidal ideation with limited intensity, and duration, some specificity in terms of plans, no associated intent, good self-control, limited dysphoria/symptomatology, some risk factors present, and identifiable protective factors, including available and accessible social support.  PLAN OF CARE: Hospital admission, medication management, substance abuse counseling, discharge planning.  Mr. Cutshaw is a 50 year old male with a history of mood instability and substance use admitted for suicidal ideation and hallucinations in the context of heavy substance use.  1. Suicidal ideation. The patient is able to contract for safety in the hospital.  2. Mood and psychosis. He was restarted  on Remeron and Haldol for  depression and hallucinations. He has not been compliant with treatment in the community.   3. Alcohol abuse. He is on Librium taper.  4. Substance abuse treatment. The patient was positive for alcohol, cannabis, cocaine. He is in no shape to discuss his treatment today.  5. Hypertension. She is on hydrochlorothiazide.  6. Chronic pancreatitis. He is on Creon.   7. Smoking. Nicotine patch is available.  8 disposition. He will be discharged to home with his brother. He will follow up with [facility] for medication management SA IOP.      I certify that inpatient services furnished can reasonably be expected to improve the patient's condition.   Kristine LineaJolanta Oluwaseun Cremer, MD 08/19/2015, 11:21 AM

## 2015-08-20 LAB — COMPREHENSIVE METABOLIC PANEL
ALT: 72 U/L — ABNORMAL HIGH (ref 17–63)
AST: 105 U/L — ABNORMAL HIGH (ref 15–41)
Albumin: 3.9 g/dL (ref 3.5–5.0)
Alkaline Phosphatase: 97 U/L (ref 38–126)
Anion gap: 8 (ref 5–15)
BUN: 15 mg/dL (ref 6–20)
CO2: 28 mmol/L (ref 22–32)
Calcium: 10 mg/dL (ref 8.9–10.3)
Chloride: 104 mmol/L (ref 101–111)
Creatinine, Ser: 1.25 mg/dL — ABNORMAL HIGH (ref 0.61–1.24)
GFR calc Af Amer: >60 mL/min (ref >60)
GFR calc non Af Amer: >60 mL/min (ref >60)
Glucose, Bld: 107 mg/dL — ABNORMAL HIGH (ref 65–99)
Potassium: 4.2 mmol/L (ref 3.5–5.1)
Sodium: 140 mmol/L (ref 135–145)
Total Bilirubin: 0.6 mg/dL (ref 0.3–1.2)
Total Protein: 7.3 g/dL (ref 6.5–8.1)

## 2015-08-20 LAB — PROLACTIN: Prolactin: 11.9 ng/mL (ref 4.0–15.2)

## 2015-08-20 LAB — LIPASE, BLOOD: Lipase: 70 U/L — ABNORMAL HIGH (ref 11–51)

## 2015-08-20 MED ORDER — CHLORDIAZEPOXIDE HCL 10 MG PO CAPS
10.0000 mg | ORAL_CAPSULE | Freq: Four times a day (QID) | ORAL | Status: DC
  Administered 2015-08-20 – 2015-08-21 (×4): 10 mg via ORAL
  Filled 2015-08-20 (×4): qty 1

## 2015-08-20 NOTE — Plan of Care (Signed)
Problem: Ineffective individual coping Goal: STG: Patient will remain free from self harm Outcome: Progressing Patient has remained free from harm since beginning of shift.

## 2015-08-20 NOTE — Plan of Care (Signed)
Problem: Diagnosis: Increased Risk For Suicide Attempt Goal: LTG-Patient Will Show Positive Response to Medication LTG (by discharge) : Patient will show positive response to medication and will participate in the development of the discharge plan.  Outcome: Progressing Compliant with medications      

## 2015-08-20 NOTE — Progress Notes (Signed)
Recreation Therapy Notes  At approximately 1:10 pm, LRT attemtped assessment. Patient requested for LRT to come back tomorrow because patient's stomach was hurting.  Jacquelynn CreeGreene,Tannar Broker M, LRT/CTRS 08/20/2015 1:13 PM

## 2015-08-20 NOTE — Plan of Care (Signed)
Problem: Ineffective individual coping Goal: STG: Patient will remain free from self harm Outcome: Progressing Unsure suicidal ideation  , voice of auditory hallucinations

## 2015-08-20 NOTE — B[facility] Group Notes (Signed)
[facility] LCSW Group Therapy   08/20/2015 1pm  Type of Therapy: Group Therapy   Participation Level: Did Not Attend. Patient invited to participate but declined.    Paymon Rosensteel F. Blondell Laperle, MSW, LCSWA, LCAS   

## 2015-08-20 NOTE — BHH Counselor (Signed)
Adult Comprehensive Assessment  Patient ID: Walter [name], male DOB: October 05, 1965, 50 y.o. MRN: [id number]  Information Source: Information source: Patient  Current Stressors:  Family Relationships: distant, stressful Housing / Lack of housing: Pt currently live with his brother.  Physical health (include injuries & life threatening diseases): Pancreatitis Social relationships: limited, most of pt's friends use Substance abuse: alcohol, crack cocaine, marijuana, cigarettes Bereavement / Loss: Wife committed suicide in 2009.   Living/Environment/Situation:  Living Arrangements: Other relatives Living conditions (as described by patient or guardian): stressful How long has patient lived in current situation?: 4-5 mos What is atmosphere in current home: Chaotic, Temporary  Family History:  Marital status: Widowed Widowed, when?: 2009 Does patient have children?: Yes How many children?: 1 How is patient's relationship with their children?: distant  Childhood History:  By whom was/is the patient raised?: Mother Additional childhood history information: poor Description of patient's relationship with caregiver when they were a child: good, supportive Patient's description of current relationship with people who raised him/her: good relationship with his 53 y/o mother Does patient have siblings?: Yes Number of Siblings: 10 Description of patient's current relationship with siblings: distant, conflictual with one sister Did patient suffer any verbal/emotional/physical/sexual abuse as a child?: No (pt denies) Did patient suffer from severe childhood neglect?: No (pt denies) Has patient ever been sexually abused/assaulted/raped as an adolescent or adult?: No (pt denies) Was the patient ever a victim of a crime or a disaster?: No (none reported) Witnessed domestic violence?: No (pt denies) Has patient been effected by domestic violence as an adult?: No (pt denies)  Education:   Highest grade of school patient has completed: 12th Currently a student?: No Learning disability?: No  Employment/Work Situation:  Employment situation: On disability Why is patient on disability: chronic pancreatitis How long has patient been on disability: 2005 Patient's job has been impacted by current illness: Yes 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 He cannot recall how much he used, he states "It was a lot." If attempted suicide, did drugs/alcohol play a role in this?: No Alcohol/Substance Abuse Treatment Hx: Past Tx, Inpatient If yes, describe treatment: [facility] 2015 Pt stated he remained sober for 4 mos following inpatient tx at [facility].  Social Support System:  Conservation officer, nature Support System: Poor Describe Community Support System: limited  Leisure/Recreation:  Leisure and Hobbies: watching tv, walking  Strengths/Needs:  What things does the patient do well?: repair things In what areas does patient struggle / problems for patient: addiction  Discharge Plan:  Does patient have access to transportation?: Yes Will patient be returning to same living situation after discharge?: No Plan for living situation after discharge: Pt would like inpatient substance abuse treatment. CSW assessing.  Currently receiving community mental health services: No If no, would patient like referral for services when discharged?: Yes (What county?) ([facility]) Does patient have financial barriers related to discharge medications?: No  Summary/Recommendations:   Patient is a 50 year old male admitted   with a diagnosis of Substance Induced Mood Disorder. Patient presented to the hospital with command hallucinations, SI, depression and substance abuse. Patient reports binging for  4-5 days prior to admission. Pt reports binging on alcohol, cocaine and marijuana. He would like an inpatient substance abuse treatment program. CSW assessing. If he cannot go to an inpatient program, he plans to return home and follow up with outpatient. Patient will benefit from crisis stabilization, medication evaluation, group therapy and psycho education in addition to case management for discharge. At discharge, it is recommended that patient remain compliant with established discharge plan and continued treatment.   Daisy FloroCandace L Tykwon Fera MSW, LCSWA  08/20/2015 11:58 AM

## 2015-08-20 NOTE — Progress Notes (Signed)
Initial Nutrition Assessment    INTERVENTION:   Meals and Snacks: encourage menu completion to best meet pt preferences   NUTRITION DIAGNOSIS:    Reassess on follow-up  GOAL:   Patient will meet greater than or equal to 90% of their needs  MONITOR:    (Energy Intake, Anthropometrics, Digestive System, Electrolyte/Renal Profile)  REASON FOR ASSESSMENT:   Malnutrition Screening Tool    ASSESSMENT:    Pt admitted with substance induced mood disorder  Past Medical History  Diagnosis Date  . Pancreatitis   . Hypertension   . PTSD (post-traumatic stress disorder)   . Bipolar 1 disorder (HCC)     Diet Order:  Diet regular Room service appropriate?: Yes; Fluid consistency:: Thin   Energy Intake: recorded po intake 90-100% of meals  Digestive System: +abdominal pain  Meds: creon with meals, remeron, thiamine  Height:   Ht Readings from Last 1 Encounters:  08/19/15 5\' 10"  (1.778 m)    Weight: weight appears stable per weight encounters  Wt Readings from Last 1 Encounters:  08/19/15 175 lb (79.379 kg)    Filed Weights   08/19/15 0115  Weight: 175 lb (79.379 kg)   Wt Readings from Last 10 Encounters:  08/19/15 175 lb (79.379 kg)  08/18/15 175 lb (79.379 kg)  02/17/15 173 lb (78.472 kg)  02/15/15 175 lb (79.379 kg)    BMI:  Body mass index is 25.11 kg/(m^2).  LOW Care Level  Romelle StarcherCate Rylon Poitra MS, [place]RD, LDN [phone] Pager  [phone] Weekend/On-Call Pager

## 2015-08-20 NOTE — [facility] Group Notes (Signed)
[facility] LCSW Aftercare Discharge Planning Group Note  08/20/2015 9:15 AM  Participation Quality: Did Not Attend. Patient invited to participate but declined.   Alejandrina Raimer F. Cassell Voorhies, MSW, LCSWA, LCAS   

## 2015-08-20 NOTE — Progress Notes (Signed)
Recreation Therapy Notes  Date: 03.08.17 Time: 3:00 pm Location: Craft Room  Group Topic: Self-esteem  Goal Area(s) Addresses:  Patient will identify positive attributes about self. Patient will identify at least one coping skill.  Behavioral Response: Did not attend  Intervention: All About Me  Activity: Patients were instructed to make an All About Me pamphlet including their life goal, positive traits, healthy coping skills, and their support system.  Education: LRT educated patients on ways they can increase their self-esteem.  Education Outcome: Patient did not attend group.  Clinical Observations/Feedback: Patient did not attend group.  Jacquelynn CreeGreene,Rodneisha Bonnet M, LRT/CTRS 08/20/2015 4:06 PM

## 2015-08-20 NOTE — Progress Notes (Signed)
D: Patient appears depressed. He isolates to room unless it's snack time. He denies SI/HI/AVH. States pain on the rights side due to pancreatis.  A: Medication given with education. Encouragement provided. PRN tylenol given.  R: Patient compliant with medication. Patient has remained calm and cooperative. Safety maintained with 15 min checks.

## 2015-08-20 NOTE — Progress Notes (Signed)
[facility] MD Progress Note  08/20/2015 3:17 PM [name]  MRN:  [id number]  Subjective:  Walter [name] is awake and more aware today. He denies suicidal or homicidal ideation he still endorses auditory hallucinations. He accepts medications and tolerates them well. There are no symptoms of alcohol withdrawal. Vital signs are stable. He now is interested in long-term treatment for alcoholism and substance use. He will be interviewed today by [employer]. Unfortunately they do not accept patients with mental illness that requires psychotropic medication. Social worker is looking at other options. The patient interacts well with peers and participates in groups.  Principal Problem: Substance induced mood disorder (HCC) Diagnosis:   Patient Active Problem List   Diagnosis Date Noted  . Cannabis use disorder, moderate, dependence (HCC) [F12.20] 08/19/2015  . Substance induced mood disorder (HCC) [F19.94] 08/18/2015  . Tobacco use disorder [F17.200] 02/17/2015  . Pancreatitis, chronic (HCC) [K86.1] 02/17/2015  . HTN (hypertension) [I10] 02/17/2015  . Alcohol withdrawal (HCC) [F10.239] 02/17/2015  . PTSD (post-traumatic stress disorder) [F43.10] 02/17/2015  . Alcohol use disorder, severe, dependence (HCC) [F10.20] 02/16/2015  . Cocaine use disorder, moderate, dependence (HCC) [F14.20] 02/16/2015   Total Time spent with patient: 20 minutes  Past Psychiatric History: Depression, psychosis, mood instability, substance use.  Past Medical History:  Past Medical History  Diagnosis Date  . Pancreatitis   . Hypertension   . PTSD (post-traumatic stress disorder)   . Bipolar 1 disorder [facility])     Past Surgical History  Procedure Laterality Date  . Cholecystectomy    . Pancreas surgery     Family History: History reviewed. No pertinent family history. Family Psychiatric  History: See H&P. Social History:  History  Alcohol Use  . 7.2 oz/week  . 12 Cans of beer per week   Comment: 1/2 of a 5th a day     History  Drug Use  . Yes  . Special: Cocaine, Marijuana    Comment: uses daily    Social History   Social History  . Marital Status: Widowed    Spouse Name: N/A  . Number of Children: N/A  . Years of Education: N/A   Social History Main Topics  . Smoking status: Current Every Day Smoker -- 0.50 packs/day for 20 years    Types: Cigarettes  . Smokeless tobacco: None  . Alcohol Use: 7.2 oz/week    12 Cans of beer per week     Comment: 1/2 of a 5th a day  . Drug Use: Yes    Special: Cocaine, Marijuana     Comment: uses daily  . Sexual Activity: Yes    Birth Control/ Protection: None   Other Topics Concern  . None   Social History Narrative   Additional Social History:                         Sleep: Fair  Appetite:  Fair  Current Medications: Current Facility-Administered Medications  Medication Dose Route Frequency Provider Last Rate Last Dose  . acetaminophen (TYLENOL) tablet 650 mg  650 mg Oral Q6H PRN Audery Amel, MD   650 mg at 08/19/15 2144  . alum & mag hydroxide-simeth (MAALOX/MYLANTA) 200-200-20 MG/5ML suspension 30 mL  30 mL Oral Q4H PRN Audery Amel, MD      . chlordiazePOXIDE (LIBRIUM) capsule 25 mg  25 mg Oral QID Braison Snoke B Najir Roop, MD   25 mg at 08/20/15 1420  . haloperidol (HALDOL) tablet 1  mg  1 mg Oral QHS Audery Amel, MD   1 mg at 08/19/15 2144  . hydrochlorothiazide (HYDRODIURIL) tablet 25 mg  25 mg Oral Daily Audery Amel, MD   25 mg at 08/20/15 1610  . lipase/protease/amylase (CREON) capsule 36,000 Units  36,000 Units Oral TID AC Audery Amel, MD   36,000 Units at 08/20/15 1220  . magnesium hydroxide (MILK OF MAGNESIA) suspension 30 mL  30 mL Oral Daily PRN Audery Amel, MD      . mirtazapine (REMERON) tablet 15 mg  15 mg Oral QHS Audery Amel, MD   15 mg at 08/19/15 2144  . thiamine (VITAMIN B-1) tablet 100 mg  100 mg Oral Daily Audery Amel, MD   100 mg at 08/20/15 9604    Lab  Results:  Results for orders placed or performed during the hospital encounter of 08/19/15 (from the past 48 hour(s))  Hemoglobin A1c     Status: None   Collection Time: 08/19/15  7:33 AM  Result Value Ref Range   Hgb A1c MFr Bld 4.7 4.0 - 6.0 %  Lipid panel, fasting     Status: None   Collection Time: 08/19/15  7:33 AM  Result Value Ref Range   Cholesterol 165 0 - 200 mg/dL   Triglycerides 540 <981 mg/dL   HDL 191 >47 mg/dL   Total CHOL/HDL Ratio 1.6 RATIO   VLDL 29 0 - 40 mg/dL   LDL Cholesterol 31 0 - 99 mg/dL    Comment:        Total Cholesterol/HDL:CHD Risk Coronary Heart Disease Risk Table                     Men   Women  1/2 Average Risk   3.4   3.3  Average Risk       5.0   4.4  2 X Average Risk   9.6   7.1  3 X Average Risk  23.4   11.0        Use the calculated Patient Ratio above and the CHD Risk Table to determine the patient's CHD Risk.        ATP III CLASSIFICATION (LDL):  <100     mg/dL   Optimal  829-562  mg/dL   Near or Above                    Optimal  130-159  mg/dL   Borderline  130-865  mg/dL   High  >784     mg/dL   Very High   Prolactin     Status: None   Collection Time: 08/19/15  7:33 AM  Result Value Ref Range   Prolactin 11.9 4.0 - 15.2 ng/mL    Comment: (NOTE) Performed At: [facility] [address] [place], [place] [id number] Mila Homer MD XL:[id number]   TSH     Status: None   Collection Time: 08/19/15  7:33 AM  Result Value Ref Range   TSH 3.151 0.350 - 4.500 uIU/mL    Blood Alcohol level:  Lab Results  Component Value Date   ETH 189* 08/18/2015   ETH 233* 02/15/2015    Physical Findings: AIMS:  , ,  ,  ,    CIWA:  CIWA-Ar Total: 0 COWS:     Musculoskeletal: Strength & Muscle Tone: within normal limits Gait & Station: normal Patient leans: N/A  Psychiatric Specialty Exam: Review of Systems  Gastrointestinal: Positive  for abdominal pain.  Psychiatric/Behavioral: Positive for depression, hallucinations  and substance abuse.  All other systems reviewed and are negative.   Blood pressure 137/101, pulse 110, temperature 98.1 F (36.7 C), temperature source Oral, resp. rate 18, height 5\' 10"  (1.778 m), weight 79.379 kg (175 lb), SpO2 100 %.Body mass index is 25.11 kg/(m^2).  General Appearance: Casual  Eye Contact::  Good  Speech:  Clear and Coherent  Volume:  Normal  Mood:  Anxious  Affect:  Appropriate  Thought Process:  Goal Directed  Orientation:  Full (Time, Place, and Person)  Thought Content:  Hallucinations: Auditory Command:  Telling him to kill himself.  Suicidal Thoughts:  Yes.  with intent/plan  Homicidal Thoughts:  No  Memory:  Immediate;   Fair Recent;   Fair Remote;   Fair  Judgement:  Poor  Insight:  Lacking  Psychomotor Activity:  Normal  Concentration:  Fair  Recall:  FiservFair  Fund of Knowledge:Fair  Language: Fair  Akathisia:  No  Handed:  Right  AIMS (if indicated):     Assets:  Communication Skills Desire for Improvement Financial Resources/Insurance Resilience  ADL's:  Intact  Cognition: WNL  Sleep:  Number of Hours: 6.25   Treatment Plan Summary: Daily contact with patient to assess and evaluate symptoms and progress in treatment and Medication management   Walter [name] is a 50 year old male with a history of mood instability and substance use admitted for suicidal ideation and hallucinations in the context of heavy substance use.  1. Suicidal ideation. The patient is able to contract for safety in the hospital.  2. Mood and psychosis. He was restarted on Remeron and Haldol for depression and hallucinations. He has not been compliant with treatment in the community.   3. Alcohol abuse. He is on Librium taper. Will decrease to 25 mg for 1 day.  4. Substance abuse treatment. The patient was positive for alcohol, cannabis, cocaine. He desires residential treatment.   5. Hypertension. She is on hydrochlorothiazide.  6. Chronic pancreatitis. He is on  Creon. He complains of abdominal pain today and asks for narcotic painkillers. No alcohol substances will be given. We'll order lipase.  7. Smoking. Nicotine patch is available.  8. Disposition. TBE. He will follow up with [facility] for medication management and SA IOP.    Kristine LineaJolanta Promise Weldin, MD 08/20/2015, 3:17 PM

## 2015-08-20 NOTE — Progress Notes (Signed)
D: Patient stated slept poor last night .Stated appetite is fair and energy level low. Stated concentration is poor. Stated on Depression scale 8 , hopeless 8 and anxiety 8  .( low 0-10 high) Voice of being tired a lot  does not express the desire to go to group.  Denies suicidal  homicidal ideations  .  No auditory hallucinations   pain concerns  Lower abdomen. Appropriate ADL'S. Interacting with peers and staff.  A: Encourage patient participation with unit programming . Instruction  Given on  Medication , verbalize understanding. R: Voice no other concerns. Staff continue to monitor

## 2015-08-20 NOTE — [facility] Group Notes (Signed)
[facility] Group Notes:  (Nursing/MHT/Case Management/Adjunct)  Date:  08/20/2015  Time:  7:01 PM  Type of Therapy:  Psychoeducational Skills  Participation Level:  Active  Participation Quality:  Appropriate  Affect:  Appropriate  Cognitive:  Appropriate  Insight:  Appropriate  Engagement in Group:  Engaged  Modes of Intervention:  Discussion, Education and Support  Summary of Progress/Problems:  Walter [name] 08/20/2015, 7:01 PM

## 2015-08-21 MED ORDER — PANCRELIPASE (LIP-PROT-AMYL) 36000-114000 UNITS PO CPEP: 36000.0000 [IU] | ORAL_CAPSULE | Freq: Three times a day (TID) | ORAL | Status: DC

## 2015-08-21 MED ORDER — MIRTAZAPINE 15 MG PO TABS: 15.0000 mg | ORAL_TABLET | Freq: Every day | ORAL | Status: DC

## 2015-08-21 MED ORDER — HALOPERIDOL 1 MG PO TABS: 1.0000 mg | ORAL_TABLET | Freq: Every day | ORAL | Status: DC

## 2015-08-21 MED ORDER — NICOTINE 21 MG/24HR TD PT24
21.0000 mg | MEDICATED_PATCH | Freq: Every day | TRANSDERMAL | Status: DC
  Administered 2015-08-21: 21 mg via TRANSDERMAL
  Filled 2015-08-21: qty 1

## 2015-08-21 MED ORDER — HYDROCHLOROTHIAZIDE 25 MG PO TABS: 25.0000 mg | ORAL_TABLET | Freq: Every day | ORAL | Status: DC

## 2015-08-21 NOTE — Plan of Care (Signed)
Problem: Diagnosis: Increased Risk For Suicide Attempt Goal: STG-Patient Will Comply With Medication Regime Outcome: Progressing Pt complying with medication regemin

## 2015-08-21 NOTE — Progress Notes (Signed)
  [facility] :  Will you be returning to the same living situation after discharge:  Yes,  [facility] At discharge, do you have transportation home?: Yes,  bus  Do you have the ability to pay for your medications: Yes,  insurance, income   Release of information consent forms completed and in the chart;  Patient's signature needed at discharge.  Patient to Follow up at: Follow-up Information    Follow up with [facility] On 08/25/2015.   Why:  Your hospital follow up appointment will be walk in. Walk in hours are Monday through Friday between 8:00am and 10:00am. Please take your ID and insurance information.    Contact information:   [address] [place] [address] [phone]       [facility].   Why:  [facility].    Contact information:   [address]  [place] [address] Phone: [phone] Fax: [phone]      Next level of care provider has access to [facility] Link:no  Safety Planning and Suicide Prevention discussed: Yes,  with patient   Have you used any form of tobacco in the last 30 days? (Cigarettes, Smokeless Tobacco, Cigars, and/or Pipes): Yes  Has patient been referred to the Quitline?: Patient refused referral  Patient has been referred for addiction treatment: Yes  Rondall Allegraandace L Gabbriella Presswood MSW, LCSWA  08/21/2015, 12:57 PM

## 2015-08-21 NOTE — Progress Notes (Signed)
D:Patient aware of discharge this shift . [facility]  . Patient received all belonging locked up . Patient denies  Suicidal  And homicidal ideations  .  A: Writer instructed on discharge criteria  . Informed of 7 day supply  and prescriptions  given topatient. Aware  Of follow up appointment . R: Patient left unit with no questions  Or concerns  With group home staff.

## 2015-08-21 NOTE — Discharge Summary (Signed)
Physician Discharge Summary Note  Patient:  Walter [name] is an 50 y.o., male MRN:  [id number] DOB:  1965/06/17 Patient phone:  [phone] (home)  Patient address:   [address] [place] [address],  Total Time spent with patient: 30 minutes  Date of Admission:  08/19/2015 Date of Discharge: 08/21/2015  Reason for Admission:  Suicidal ideation and hallucinations in the context of substance use.  Identifying data. Walter [name] is a 50 year old male with a history of depression, mood instability, and substance use.  Chief complaint. "I have voices telling me to kill myself."  History of present illness. Information was obtained from the patient and the chart the patient has a long history of substance and mood instability. He has been hospitalized numerous times for detox and was admitted at least twice for Rehabilitation. There is a long history of treatment noncompliance. Following his last hospitalization at [facility] he did not follow-up with mental health professionals no medications. He returns to the hospital complaining of severe depression and appetite anhedonia, feeling of guilt and hopelessness worthlessness, poor energy and concentration, social isolation, crying cells suicidal ideation with a plan to walk into traffic and auditory command hallucinations. This is in the context of heavy alcohol cocaine and cannabis use. The patient denies symptoms suggestive of bipolar mania. He is unable to tell me how much she drinks. Reportedly it is liquor and as much as he can get. He has also used cocaine heavily lately. He came to the hospital asking for detox and complaining of suicidal ideation and auditory command hallucinations.  Past psychiatric history. Several prior hospitalization for detox. He was in the residential program in [place] several years ago for 6 or 7 months. He relapsed immediately following discharge. He was to a dark before. He does not feel  he benefited. He reported several suicide attempts or aborted suicide traffic in drinking antifreeze. In the past he has been maintained on a combination of Haldol, Prozac, and Remeron but has not been taking any medications lately. At some point she was diagnosed with PTSD when he found his wife who committed suicide.   Family psychiatric history. He denies any.  Social history. He graduated from high school. He used to work at [employer]. And his wife committed suicide. He is disabled from chronic pancreatitis. He has Medicare. He has been staying with different family members most recently with his brother.   Principal Problem: Substance induced mood disorder [facility]) Discharge Diagnoses: Patient Active Problem List   Diagnosis Date Noted  . Cannabis use disorder, moderate, dependence (HCC) [F12.20] 08/19/2015  . Substance induced mood disorder (HCC) [F19.94] 08/18/2015  . Tobacco use disorder [F17.200] 02/17/2015  . Pancreatitis, chronic (HCC) [K86.1] 02/17/2015  . HTN (hypertension) [I10] 02/17/2015  . Alcohol withdrawal (HCC) [F10.239] 02/17/2015  . PTSD (post-traumatic stress disorder) [F43.10] 02/17/2015  . Alcohol use disorder, severe, dependence (HCC) [F10.20] 02/16/2015  . Cocaine use disorder, moderate, dependence (HCC) [F14.20] 02/16/2015    Past Psychiatric History: Substance abuse, depression, mood instability.  Past Medical History:  Past Medical History  Diagnosis Date  . Pancreatitis   . Hypertension   . PTSD (post-traumatic stress disorder)   . Bipolar 1 disorder [facility])     Past Surgical History  Procedure Laterality Date  . Cholecystectomy    . Pancreas surgery     Family History: History reviewed. No pertinent family history. Family Psychiatric  History: None reported. Social History:  History  Alcohol Use  . 7.2  oz/week  . 12 Cans of beer per week    Comment: 1/2 of a 5th a day     History  Drug Use  . Yes  . Special: Cocaine, Marijuana    Comment: uses  daily    Social History   Social History  . Marital Status: Widowed    Spouse Name: N/A  . Number of Children: N/A  . Years of Education: N/A   Social History Main Topics  . Smoking status: Current Every Day Smoker -- 0.50 packs/day for 20 years    Types: Cigarettes  . Smokeless tobacco: None  . Alcohol Use: 7.2 oz/week    12 Cans of beer per week     Comment: 1/2 of a 5th a day  . Drug Use: Yes    Special: Cocaine, Marijuana     Comment: uses daily  . Sexual Activity: Yes    Birth Control/ Protection: None   Other Topics Concern  . None   Social History Narrative    Hospital Course:    Walter [name] is a 50 year old male with a history of mood instability and substance use admitted for suicidal ideation and hallucinations in the context of heavy substance use.  1. Suicidal ideation. This has resolved. The patient is able to contract for safety. He is forward thinking and optimistic about the future.  2. Mood and psychosis. He was restarted on Remeron and Haldol for depression and hallucinations. He has not been compliant with treatment in the community.   3. Alcohol abuse. He completed Librium taper. This was uncomplicated detox. Vital signs were stable.  4. Substance abuse treatment. The patient was positive for alcohol, cannabis, cocaine. He desired substance abuse treatment and [facility].    5. Hypertension. She is on hydrochlorothiazide.  6. Chronic pancreatitis. He is on Creon. He complains of abdominal pain today and asks for narcotic painkillers. No controlled medications were given. There is slight elevation of LFTs and Lipase at 70.   7. Smoking. Nicotine patch was available.  8. Disposition. [facility]. He will follow up with [facility] for medication management and [facility] IOP.   Physical Findings: AIMS:  , ,  ,  ,    CIWA:  CIWA-Ar Total: 7 COWS:     Musculoskeletal: Strength & Muscle Tone: within normal  limits Gait & Station: normal Patient leans: N/A  Psychiatric Specialty Exam: Review of Systems  Gastrointestinal: Positive for abdominal pain.  Psychiatric/Behavioral: Positive for substance abuse.  All other systems reviewed and are negative.   Blood pressure 127/86, pulse 86, temperature 98.6 F (37 C), temperature source Oral, resp. rate 18, height 5\' 10"  (1.778 m), weight 79.379 kg (175 lb), SpO2 100 %.Body mass index is 25.11 kg/(m^2).  See SRA.                                                  Sleep:  Number of Hours: 6   Have you used any form of tobacco in the last 30 days? (Cigarettes, Smokeless Tobacco, Cigars, and/or Pipes): Yes  Has this patient used any form of tobacco in the last 30 days? (Cigarettes, Smokeless Tobacco, Cigars, and/or Pipes) Yes, Yes, A prescription for an FDA-approved tobacco cessation medication was offered at discharge and the patient refused  Blood Alcohol level:  Lab  Results  Component Value Date   [facility] 189* 08/18/2015   ETH 233* 02/15/2015    Metabolic Disorder Labs:  Lab Results  Component Value Date   HGBA1C 4.7 08/19/2015   Lab Results  Component Value Date   PROLACTIN 11.9 08/19/2015   Lab Results  Component Value Date   CHOL 165 08/19/2015   TRIG 144 08/19/2015   HDL 105 08/19/2015   CHOLHDL 1.6 08/19/2015   VLDL 29 08/19/2015   LDLCALC 31 08/19/2015   LDLCALC 30 04/06/2013    See Psychiatric Specialty Exam and Suicide Risk Assessment completed by Attending Physician prior to discharge.  Discharge destination:  Home  Is patient on multiple antipsychotic therapies at discharge:  No   Has Patient had three or more failed trials of antipsychotic monotherapy by history:  No  Recommended Plan for Multiple Antipsychotic Therapies: NA  Discharge Instructions    Diet - low sodium heart healthy    Complete by:  As directed      Increase activity slowly    Complete by:  As directed              Medication List    STOP taking these medications        FLUoxetine 10 MG capsule  Commonly known as:  PROZAC      TAKE these medications      Indication   haloperidol 1 MG tablet  Commonly known as:  HALDOL  Take 1 tablet (1 mg total) by mouth at bedtime.   Indication:  Psychosis     hydrochlorothiazide 25 MG tablet  Commonly known as:  HYDRODIURIL  Take 1 tablet (25 mg total) by mouth daily.   Indication:  High Blood Pressure     lipase/protease/amylase 16109 UNITS Cpep capsule  Commonly known as:  CREON  Take 1 capsule (36,000 Units total) by mouth 3 (three) times daily with meals.   Indication:  Pancreatic Insufficiency     mirtazapine 15 MG tablet  Commonly known as:  REMERON  Take 1 tablet (15 mg total) by mouth at bedtime.   Indication:  Major Depressive Disorder         Follow-up recommendations:  Activity:  As tolerated. Diet:  Low sodium heart healthy. Other:  Keep follow-up appointments.  Comments:    Signed: Kristine Linea, MD 08/21/2015, 10:41 AM

## 2015-08-21 NOTE — [facility] Group Notes (Signed)
[facility] LCSW Group Therapy   08/21/2015 1pm   Type of Therapy: Group Therapy   Participation Level: Active   Participation Quality: Attentive, Sharing and Supportive   Affect: Appropriate   Cognitive: Alert and Oriented   Insight: Developing/Improving and Engaged   Engagement in Therapy: Developing/Improving and Engaged   Modes of Intervention: Clarification, Confrontation, Discussion, Education, Exploration, Limit-setting, Orientation, Problem-solving, Rapport Building, Dance movement psychotherapisteality Testing, Socialization and Support   Summary of Progress/Problems: The topic for group was balance in life. Today's group focused on defining balance in one's own words, identifying things that can knock one off balance, and exploring healthy ways to maintain balance in life. Group members were asked to provide an example of a time when they felt off balance, describe how they handled that situation, and process healthier ways to regain balance in the future. Group members were asked to share the most important tool for maintaining balance that they learned [facility] and how they plan to apply this method after discharge. Pt shared that the issues in life that he is dealing with are so numerous he can't even begin to list them all.  Pt stated that these issues can all be referred to as "life".  Pt shared his desire to learn coping techniques that will allow him to be a more balance person.  Pt shared that he believes that balancing any = issues in life are key to his mental health recovery upon discharge.  Pt was polite and cooperative with the CSW and other group members and focused and attentive to the topics discussed and the sharing of others.  Pt shared that in the past he reacted in a negative fashion to stress in his life which resulted in the pt being admitted and attributes this to a lack of coping mechanisms.

## 2015-08-21 NOTE — [facility] Group Notes (Signed)
[facility] Group Notes:  (Nursing/MHT/Case Management/Adjunct)  Date:  08/21/2015  Time:  12:43 AM  Type of Therapy:  Group Therapy  Participation Level:  Did Not Attend  Summary of Progress/Problems:  Walter [name] 08/21/2015, 12:43 AM

## 2015-08-21 NOTE — Plan of Care (Signed)
Problem: Alteration in thought process Goal: LTG-Patient has not harmed self or others in at least 2 days Outcome: Progressing No harm to self , compliant  With unit programing

## 2015-08-21 NOTE — [facility] Suicide Risk Assessment (Signed)
[facility] INPATIENT:  Family/Significant Other Suicide Prevention Education  Suicide Prevention Education:  Patient Refusal for Family/Significant Other Suicide Prevention Education: The patient Walter JenkinsBobby A Snedden has refused to provide written consent for family/significant other to be provided Family/Significant Other Suicide Prevention Education during admission and/or prior to discharge.  Physician notified.  SPE completed with pt and he was encouraged to share information provided in SPI pamphlet with support network, ask questions, and talk about any concerns relating to SPE. Pt denies SI/HI/AVH and verbalized understanding of information presented.    Sempra EnergyCandace L Collins Dimaria MSW, LCSWA  08/21/2015, 12:59 PM

## 2015-08-21 NOTE — [facility] Group Notes (Signed)
[facility] Group Notes:  (Nursing/MHT/Case Management/Adjunct)  Date:  08/21/2015  Time:  3:25 PM  Type of Therapy:  Psychoeducational Skills  Participation Level:  Minimal  Participation Quality:  Appropriate  Affect:  Appropriate  Cognitive:  Appropriate  Insight:  Appropriate  Engagement in Group:  Supportive  Modes of Intervention:  Activity  Summary of Progress/Problems:  Walter [name] 08/21/2015, 3:25 PM

## 2015-08-21 NOTE — [facility] Suicide Risk Assessment (Signed)
[facility]   Principal Problem: Substance induced mood disorder [facility](HCC) Discharge Diagnoses:  Patient Active Problem List   Diagnosis Date Noted  . Cannabis use disorder, moderate, dependence (HCC) [F12.20] 08/19/2015  . Substance induced mood disorder (HCC) [F19.94] 08/18/2015  . Tobacco use disorder [F17.200] 02/17/2015  . Pancreatitis, chronic (HCC) [K86.1] 02/17/2015  . HTN (hypertension) [I10] 02/17/2015  . Alcohol withdrawal (HCC) [F10.239] 02/17/2015  . PTSD (post-traumatic stress disorder) [F43.10] 02/17/2015  . Alcohol use disorder, severe, dependence (HCC) [F10.20] 02/16/2015  . Cocaine use disorder, moderate, dependence (HCC) [F14.20] 02/16/2015    Total Time spent with patient: 30 minutes  Musculoskeletal: Strength & Muscle Tone: within normal limits Gait & Station: normal Patient leans: N/A  Psychiatric Specialty Exam: Review of Systems  Gastrointestinal: Positive for abdominal pain.  Psychiatric/Behavioral: Positive for substance abuse.  All other systems reviewed and are negative.   Blood pressure 127/86, pulse 86, temperature 98.6 F (37 C), temperature source Oral, resp. rate 18, height 5\' 10"  (1.778 m), weight 79.379 kg (175 lb), SpO2 100 %.Body mass index is 25.11 kg/(m^2).  General Appearance: Casual  Eye Contact::  Good  Speech:  Clear and Coherent409  Volume:  Normal  Mood:  Anxious  Affect:  Flat  Thought Process:  Goal Directed  Orientation:  Full (Time, Place, and Person)  Thought Content:  WDL  Suicidal Thoughts:  No  Homicidal Thoughts:  No  Memory:  Immediate;   Fair Recent;   Fair Remote;   Fair  Judgement:  Impaired  Insight:  Shallow  Psychomotor Activity:  Normal  Concentration:  Fair  Recall:  FiservFair  Fund of Knowledge:Fair  Language: Fair  Akathisia:  No  Handed:  Right  AIMS (if indicated):     Assets:  Communication Skills Desire for Improvement Financial Resources/Insurance Resilience Social  Support  Sleep:  Number of Hours: 6  Cognition: WNL  ADL's:  Intact   Mental Status Per Nursing Assessment::   On Admission:  NA  Demographic Factors:  Male and Low socioeconomic status  Loss Factors: Decline in physical health and Financial problems/change in socioeconomic status  Historical Factors: Impulsivity  Risk Reduction Factors:   Sense of responsibility to family, Positive social support and Positive therapeutic relationship  Continued Clinical Symptoms:  Depression:   Comorbid alcohol abuse/dependence Impulsivity Alcohol/Substance Abuse/Dependencies  Cognitive Features That Contribute To Risk:  None    Suicide Risk:  Minimal: No identifiable suicidal ideation.  Patients presenting with no risk factors but with morbid ruminations; may be classified as minimal risk based on the severity of the depressive symptoms    Plan Of Care/Follow-up recommendations:  Activity:  As tolerated. Diet:  Low sodium heart healthy. Other:  Keep follow-up appointments.  Kristine LineaJolanta Shondale Quinley, MD 08/21/2015, 10:37 AM

## 2015-08-21 NOTE — Tx Team (Signed)
Interdisciplinary Treatment Plan Update (Adult)  Date:  08/21/2015 Time Reviewed:  3:26 PM  Progress in Treatment: Attending groups: No. Participating in groups:  No. Taking medication as prescribed:  Yes. Tolerating medication:  Yes. Family/Significant othe contact made:  No, will contact:  Pt refused  Patient understands diagnosis:  Yes. Discussing patient identified problems/goals with staff:  Yes. Medical problems stabilized or resolved:  Yes. Denies suicidal/homicidal ideation: Yes. Issues/concerns per patient self-inventory:  Yes. Other:  New problem(s) identified: No, Describe:  NA  Discharge Plan or Barriers: Pt will go to [facility] upon discharged.   Reason for Continuation of Hospitalization: Depression Suicidal ideation Withdrawal symptoms  Comments: Walter [name] is awake and more aware today. He denies suicidal or homicidal ideation he still endorses auditory hallucinations. He accepts medications and tolerates them well. There are no symptoms of alcohol withdrawal. Vital signs are stable. He now is interested in long-term treatment for alcoholism and substance use. He will be interviewed today by [facility]. Unfortunately they do not accept patients with mental illness that requires psychotropic medication. Social worker is looking at other options. The patient interacts well with peers and participates in groups.  Estimated length of stay: Pt will likely discharged today.   New goal(s): NA  Review of initial/current patient goals per problem list:   1.  Goal(s): Patient will participate in aftercare plan * Met: Yes * Target date: at discharge * As evidenced by: Patient will participate within aftercare plan AEB aftercare provider and housing plan at discharge being identified.   2.  Goal (s): Patient will exhibit decreased depressive symptoms and suicidal ideations. * Met: Yes *  Target date: at discharge * As evidenced by: Patient will  utilize self rating of depression at 3 or below and demonstrate decreased signs of depression or be deemed stable for discharge by MD.   3.  Goal(s): Patient will demonstrate decreased signs and symptoms of anxiety. * Met: Yes * Target date: at discharge * As evidenced by: Patient will utilize self rating of anxiety at 3 or below and demonstrated decreased signs of anxiety, or be deemed stable for discharge by MD   4.  Goal(s): Patient will demonstrate decreased signs of withdrawal due to substance abuse * Met: Yes * Target date: at discharge * As evidenced by: Patient will produce a CIWA/COWS score of 0, have stable vitals signs, and no symptoms of withdrawal.  Attendees: Patient:  Walter [name] 3/9/20173:26 PM  Family:   3/9/20173:26 PM  Physician:   Dr. Bary Leriche  3/9/20173:26 PM  Nursing:   Meredith Mody, RN  3/9/20173:26 PM  Case Manager:   3/9/20173:26 PM  Counselor:   3/9/20173:26 PM  Other:  Wray Kearns, Mogul 3/9/20173:26 PM  Other:  Everitt Amber, LRT  3/9/20173:26 PM  Other:   3/9/20173:26 PM  Other:  3/9/20173:26 PM  Other:  3/9/20173:26 PM  Other:  3/9/20173:26 PM  Other:  3/9/20173:26 PM  Other:  3/9/20173:26 PM  Other:  3/9/20173:26 PM  Other:   3/9/20173:26 PM   Scribe for Treatment Team:   Wray Kearns, MSW, [place]  08/21/2015, 3:26 PM

## 2015-08-21 NOTE — Progress Notes (Signed)
Recreation Therapy Notes  Date: 03.09.17 Time: 1:00 pm Location: Craft Room  Group Topic: Leisure Education/Coping Skills  Goal Area(s) Addresses:  Patient will identify things they are grateful for.  Patient will identify how being grateful can influence decision making.  Behavioral Response: Attentive  Intervention: LexicographerGrateful Wheel  Activity: Patients were given an I Am Grateful For worksheet and encouraged to list 2-3 things they were grateful for under each category.  Education: LRT educated patient on leisure and why it is a good Associate Professorcoping skill.  Education Outcome: In group clarification offered  Clinical Observations/Feedback: Patient completed activity by writing at least one thing he was grateful for under each category. Patient did not participate in group.  Jacquelynn CreeGreene,Ita Fritzsche M, LRT/CTRS 08/21/2015 3:02 PM

## 2015-08-21 NOTE — Progress Notes (Signed)
D: Observed pt in dayooom Patient alert and oriented x4. Patient endorses passive SI without a plan but pt contracts for safety. Pt also endorses AH "telling me to to crazy stuff." Pt refuses HI VH.  Pt affect blunted and depressed. Pt rated anxiety 7/10 and depression 9/10 stating "I'm very depressed." Pt on CIWA, q6. Last CIWA was 7, with minor tremors, anxiety ,nausea, and headache.  A: Offered active listening and support. Provided therapeutic communication. Administered scheduled medications. Encouraged pt to continue attending grpps. Reinforced importance of contacting staff when thoughts of self harm emerge. R: Pt pleasant and cooperative. Pt medication compliant. Will continue Q15 min. checks. Safety maintained.

## 2016-07-28 ENCOUNTER — Emergency Department
Admission: EM | Admit: 2016-07-28 | Discharge: 2016-07-28 | Disposition: A | Discharge status: Home / Self Care | Payer: Medicare Other | Attending: Emergency Medicine | Admitting: Emergency Medicine

## 2016-07-28 ENCOUNTER — Encounter: Payer: Self-pay | Admitting: *Deleted

## 2016-07-28 DIAGNOSIS — I10 Essential (primary) hypertension: Secondary | ICD-10-CM | POA: Diagnosis not present

## 2016-07-28 DIAGNOSIS — F17210 Nicotine dependence, cigarettes, uncomplicated: Secondary | ICD-10-CM | POA: Insufficient documentation

## 2016-07-28 DIAGNOSIS — R51 Headache: Secondary | ICD-10-CM | POA: Diagnosis present

## 2016-07-28 DIAGNOSIS — J111 Influenza due to unidentified influenza virus with other respiratory manifestations: Secondary | ICD-10-CM | POA: Diagnosis not present

## 2016-07-28 DIAGNOSIS — Z79899 Other long term (current) drug therapy: Secondary | ICD-10-CM | POA: Diagnosis not present

## 2016-07-28 MED ORDER — FLUTICASONE PROPIONATE 50 MCG/ACT NA SUSP: 2.0000 | Freq: Every day | NASAL | 0 refills | Status: DC

## 2016-07-28 MED ORDER — GUAIFENESIN-CODEINE 100-10 MG/5ML PO SYRP: 5.0000 mL | ORAL_SOLUTION | Freq: Three times a day (TID) | ORAL | 0 refills | Status: AC | PRN

## 2016-07-28 NOTE — ED Notes (Signed)
See triage note states he developed body aches ,headache and cough which started 3 days ago  Afebrile on arrival

## 2016-07-28 NOTE — ED Notes (Signed)
Pt states hx HTN but not currently taking any medicine for it

## 2016-07-28 NOTE — ED Triage Notes (Signed)
Pt states body aches, headache, fever for 3 days

## 2016-07-28 NOTE — ED Provider Notes (Signed)
[facility] Emergency Department Provider Note  ____________________________________________  Time seen: Approximately 4:01 PM  I have reviewed the triage vital signs and the nursing notes.   HISTORY  Chief Complaint Headache and Chills    HPI Walter [name] is a 51 y.o. male that presents to the emergency department with 3 days of headache, chills, muscle aches, congestion, productive cough with clear phlegm, nausea, vomiting 1, and mild diarrhea. Patient is drinking well but not eating as much. No alleviating measures have been attempted. Patient did not receive flu shot this year. Patient denies shortness of breath, chest pain, abdominal pain, constipation.   Past Medical History:  Diagnosis Date  . Bipolar 1 disorder (HCC)   . Hypertension   . Pancreatitis   . PTSD (post-traumatic stress disorder)     Patient Active Problem List   Diagnosis Date Noted  . Cannabis use disorder, moderate, dependence (HCC) 08/19/2015  . Substance induced mood disorder (HCC) 08/18/2015  . Tobacco use disorder 02/17/2015  . Pancreatitis, chronic (HCC) 02/17/2015  . HTN (hypertension) 02/17/2015  . Alcohol withdrawal (HCC) 02/17/2015  . PTSD (post-traumatic stress disorder) 02/17/2015  . Alcohol use disorder, severe, dependence (HCC) 02/16/2015  . Cocaine use disorder, moderate, dependence (HCC) 02/16/2015    Past Surgical History:  Procedure Laterality Date  . CHOLECYSTECTOMY    . PANCREAS SURGERY      Prior to Admission medications   Medication Sig Start Date End Date Taking? Authorizing Provider  fluticasone (FLONASE) 50 MCG/ACT nasal spray Place 2 sprays into both nostrils daily. 07/28/16 07/28/17  Enid Derry, PA-C  guaiFENesin-codeine (ROBITUSSIN AC) 100-10 MG/5ML syrup Take 5 mLs by mouth 3 (three) times daily as needed for cough. 07/28/16 07/30/16  Enid Derry, PA-C  haloperidol (HALDOL) 1 MG tablet Take 1 tablet (1 mg total) by mouth at bedtime. 08/21/15    Shari Prows, MD  hydrochlorothiazide (HYDRODIURIL) 25 MG tablet Take 1 tablet (25 mg total) by mouth daily. 08/21/15   Shari Prows, MD  lipase/protease/amylase (CREON) 36000 UNITS CPEP capsule Take 1 capsule (36,000 Units total) by mouth 3 (three) times daily with meals. 08/21/15   Shari Prows, MD  mirtazapine (REMERON) 15 MG tablet Take 1 tablet (15 mg total) by mouth at bedtime. 08/21/15   Shari Prows, MD    Allergies Patient has no known allergies.  History reviewed. No pertinent family history.  Social History Social History  Substance Use Topics  . Smoking status: Current Every Day Smoker    Packs/day: 0.50    Years: 20.00    Types: Cigarettes  . Smokeless tobacco: Not on file  . Alcohol use 7.2 oz/week    12 Cans of beer per week     Comment: 1/2 of a 5th a day     Review of Systems  Eyes: No visual changes. No discharge. ENT: Positive for congestion and rhinorrhea. Cardiovascular: No chest pain. Respiratory: Positive for cough. No SOB. Gastrointestinal: No abdominal pain. No constipation. Musculoskeletal: Positive for musculoskeletal pain. Skin: Negative for rash, abrasions, lacerations, ecchymosis. Neurological: Positive for headaches.   ____________________________________________   PHYSICAL EXAM:  VITAL SIGNS: ED Triage Vitals [07/28/16 1448]  Enc Vitals Group     BP (!) 187/118     Pulse Rate 100     Resp 18     Temp 98 F (36.7 C)     Temp Source Oral     SpO2 98 %     Weight 195 lb (88.5  kg)     Height 5\' 10"  (1.778 m)     Head Circumference      Peak Flow      Pain Score 8     Pain Loc      Pain Edu?      Excl. in GC?      Constitutional: Alert and oriented. Well appearing and in no acute distress. Eyes: Conjunctivae are normal. PERRL. EOMI. No discharge. Head: Atraumatic. ENT: No frontal and maxillary sinus tenderness.      Ears: Tympanic membranes pearly gray with good landmarks. No discharge.       Nose: Mild congestion/rhinnorhea.      Mouth/Throat: Mucous membranes are moist. Oropharynx non-erythematous. Tonsils not enlarged. No exudates. Uvula midline. Neck: No stridor.   Hematological/Lymphatic/Immunilogical: No cervical lymphadenopathy. Cardiovascular: Normal rate, regular rhythm. Good peripheral circulation. Respiratory: Normal respiratory effort without tachypnea or retractions. Lungs CTAB. Good air entry to the bases with no decreased or absent breath sounds. Gastrointestinal: Bowel sounds 4 quadrants. Soft and nontender to palpation. No guarding or rigidity. No palpable masses. No distention. Musculoskeletal: Full range of motion to all extremities. No gross deformities appreciated. Neurologic:  Normal speech and language. No gross focal neurologic deficits are appreciated.  Skin:  Skin is warm, dry and intact. No rash noted. Psychiatric: Mood and affect are normal. Speech and behavior are normal.    ____________________________________________   LABS (all labs ordered are listed, but only abnormal results are displayed)  Labs Reviewed - No data to display ____________________________________________  EKG   ____________________________________________  RADIOLOGY   No results found.  ____________________________________________    PROCEDURES  Procedure(s) performed:    Procedures    Medications - No data to display   ____________________________________________   INITIAL IMPRESSION / ASSESSMENT AND PLAN / ED COURSE  Pertinent labs & imaging results that were available during my care of the patient were reviewed by me and considered in my medical decision making (see chart for details).  Review of the [place] CSRS was performed in accordance of the [facility] prior to dispensing any controlled drugs.     Patient's diagnosis is consistent with influenza. Exam is reassuring. He is afebrile and staying well hydrated. He is outside the window to begin  Tamiflu. Patient does not see a primary care provider for hypertension and does not take hypertension medications. Patient was educated about hypertension and risk of stroke. Patient was instructed to call PCP tomorrow to begin a hypertension regimen. Patient will be discharged home with prescriptions for Flonase and Robitussin. Patient is to follow up with PCP as needed or otherwise directed. Patient is given ED precautions to return to the ED for any worsening or new symptoms.     ____________________________________________  FINAL CLINICAL IMPRESSION(S) / ED DIAGNOSES  Final diagnoses:  Influenza      NEW MEDICATIONS STARTED DURING THIS VISIT:  New Prescriptions   FLUTICASONE (FLONASE) 50 MCG/ACT NASAL SPRAY    Place 2 sprays into both nostrils daily.   GUAIFENESIN-CODEINE (ROBITUSSIN AC) 100-10 MG/5ML SYRUP    Take 5 mLs by mouth 3 (three) times daily as needed for cough.        This chart was dictated using voice recognition software/Dragon. Despite best efforts to proofread, errors can occur which can change the meaning. Any change was purely unintentional.    Enid DerryAshley Dally Oshel, PA-C 07/28/16 1657    Jennye MoccasinBrian S Quigley, MD 07/28/16 [phone]

## 2017-08-11 ENCOUNTER — Other Ambulatory Visit: Payer: Self-pay

## 2017-08-11 ENCOUNTER — Emergency Department
Admission: EM | Admit: 2017-08-11 | Discharge: 2017-08-12 | Disposition: A | Discharge status: Home / Self Care | Payer: Medicare Other | Attending: Emergency Medicine | Admitting: Emergency Medicine

## 2017-08-11 ENCOUNTER — Encounter: Payer: Self-pay | Admitting: Emergency Medicine

## 2017-08-11 DIAGNOSIS — K861 Other chronic pancreatitis: Secondary | ICD-10-CM

## 2017-08-11 DIAGNOSIS — F17210 Nicotine dependence, cigarettes, uncomplicated: Secondary | ICD-10-CM | POA: Insufficient documentation

## 2017-08-11 DIAGNOSIS — I10 Essential (primary) hypertension: Secondary | ICD-10-CM | POA: Diagnosis not present

## 2017-08-11 DIAGNOSIS — R1010 Upper abdominal pain, unspecified: Secondary | ICD-10-CM | POA: Diagnosis present

## 2017-08-11 DIAGNOSIS — Z79899 Other long term (current) drug therapy: Secondary | ICD-10-CM | POA: Diagnosis not present

## 2017-08-11 DIAGNOSIS — R1013 Epigastric pain: Secondary | ICD-10-CM

## 2017-08-11 LAB — CBC WITH DIFFERENTIAL/PLATELET
Basophils Absolute: 0 10*3/uL (ref 0–0.1)
Basophils Relative: 0 %
Eosinophils Absolute: 0 10*3/uL (ref 0–0.7)
Eosinophils Relative: 0 %
HCT: 43.7 % (ref 40.0–52.0)
Hemoglobin: 14.3 g/dL (ref 13.0–18.0)
Lymphocytes Relative: 9 %
Lymphs Abs: 0.9 10*3/uL — ABNORMAL LOW (ref 1.0–3.6)
MCH: 30.4 pg (ref 26.0–34.0)
MCHC: 32.8 g/dL (ref 32.0–36.0)
MCV: 92.8 fL (ref 80.0–100.0)
Monocytes Absolute: 1 10*3/uL (ref 0.2–1.0)
Monocytes Relative: 10 %
Neutro Abs: 8.3 10*3/uL — ABNORMAL HIGH (ref 1.4–6.5)
Neutrophils Relative %: 81 %
Platelets: 149 10*3/uL — ABNORMAL LOW (ref 150–440)
RBC: 4.71 MIL/uL (ref 4.40–5.90)
RDW: 14.1 % (ref 11.5–14.5)
WBC: 10.4 10*3/uL (ref 3.8–10.6)

## 2017-08-11 LAB — LIPASE, BLOOD: Lipase: 72 U/L — ABNORMAL HIGH (ref 11–51)

## 2017-08-11 LAB — COMPREHENSIVE METABOLIC PANEL
ALT: 80 U/L — ABNORMAL HIGH (ref 17–63)
AST: 87 U/L — ABNORMAL HIGH (ref 15–41)
Albumin: 4.2 g/dL (ref 3.5–5.0)
Alkaline Phosphatase: 90 U/L (ref 38–126)
Anion gap: 15 (ref 5–15)
BUN: 19 mg/dL (ref 6–20)
CO2: 26 mmol/L (ref 22–32)
Calcium: 9.7 mg/dL (ref 8.9–10.3)
Chloride: 99 mmol/L — ABNORMAL LOW (ref 101–111)
Creatinine, Ser: 1.17 mg/dL (ref 0.61–1.24)
GFR calc Af Amer: >60 mL/min (ref >60)
GFR calc non Af Amer: >60 mL/min (ref >60)
Glucose, Bld: 131 mg/dL — ABNORMAL HIGH (ref 65–99)
Potassium: 2.8 mmol/L — ABNORMAL LOW (ref 3.5–5.1)
Sodium: 140 mmol/L (ref 135–145)
Total Bilirubin: 0.9 mg/dL (ref 0.3–1.2)
Total Protein: 7.9 g/dL (ref 6.5–8.1)

## 2017-08-11 MED ORDER — SODIUM CHLORIDE 0.9 % IV BOLUS (SEPSIS)
1000.0000 mL | Freq: Once | INTRAVENOUS | Status: AC
  Administered 2017-08-11: 1000 mL via INTRAVENOUS

## 2017-08-11 MED ORDER — HALOPERIDOL LACTATE 5 MG/ML IJ SOLN
5.0000 mg | Freq: Once | INTRAMUSCULAR | Status: AC
  Administered 2017-08-11: 5 mg via INTRAVENOUS
  Filled 2017-08-11: qty 1

## 2017-08-11 NOTE — ED Triage Notes (Signed)
Pt to triage via w/c, appears uncomfortable, grimacing; pt c/o upper abd pain accomp by N/V since yesterday; st hx pancreatitis; seen at [facility] last week for biopsy of pancreas

## 2017-08-11 NOTE — ED Provider Notes (Signed)
[facility] Emergency Department Provider Note   ____________________________________________   I have reviewed the triage vital signs and the nursing notes.   HISTORY  Chief Complaint Abdominal Pain   History limited by: Not Limited   HPI Walter [name] is a 52 y.o. male who presents to the emergency department today because of concern for acute flare of chronic pancreatitis. The severe pain started yesterday and has continued today. It has been constant. Located in the upper abdomen and back. It has been accompanied by nausea and vomiting. He denies any fevers. 9 days ago he states he underwent EGD with biopsy of the pancreas. This was performed at [facility].    Per medical record review patient has a history of pancreatitis, substance use disorder. Has seen pain management in the past.   Past Medical History:  Diagnosis Date  . Bipolar 1 disorder (HCC)   . Hypertension   . Pancreatitis   . PTSD (post-traumatic stress disorder)     Patient Active Problem List   Diagnosis Date Noted  . Cannabis use disorder, moderate, dependence (HCC) 08/19/2015  . Substance induced mood disorder (HCC) 08/18/2015  . Tobacco use disorder 02/17/2015  . Pancreatitis, chronic (HCC) 02/17/2015  . HTN (hypertension) 02/17/2015  . Alcohol withdrawal (HCC) 02/17/2015  . PTSD (post-traumatic stress disorder) 02/17/2015  . Alcohol use disorder, severe, dependence (HCC) 02/16/2015  . Cocaine use disorder, moderate, dependence (HCC) 02/16/2015    Past Surgical History:  Procedure Laterality Date  . CHOLECYSTECTOMY    . PANCREAS SURGERY      Prior to Admission medications   Medication Sig Start Date End Date Taking? Authorizing Provider  fluticasone (FLONASE) 50 MCG/ACT nasal spray Place 2 sprays into both nostrils daily. 07/28/16 07/28/17  Enid Derry, PA-C  haloperidol (HALDOL) 1 MG tablet Take 1 tablet (1 mg total) by mouth at bedtime. 08/21/15   Pucilowska, Jolanta B, MD   hydrochlorothiazide (HYDRODIURIL) 25 MG tablet Take 1 tablet (25 mg total) by mouth daily. 08/21/15   Pucilowska, Jolanta B, MD  lipase/protease/amylase (CREON) 36000 UNITS CPEP capsule Take 1 capsule (36,000 Units total) by mouth 3 (three) times daily with meals. 08/21/15   Pucilowska, Jolanta B, MD  mirtazapine (REMERON) 15 MG tablet Take 1 tablet (15 mg total) by mouth at bedtime. 08/21/15   Pucilowska, Ellin Goodie, MD    Allergies Patient has no known allergies.  No family history on file.  Social History Social History   Tobacco Use  . Smoking status: Current Every Day Smoker    Packs/day: 0.50    Years: 20.00    Pack years: 10.00    Types: Cigarettes  Substance Use Topics  . Alcohol use: Yes    Alcohol/week: 7.2 oz    Types: 12 Cans of beer per week    Comment: 1/2 of a 5th a day  . Drug use: Yes    Types: Cocaine, Marijuana    Comment: uses daily    Review of Systems Constitutional: No fever/chills Eyes: No visual changes. ENT: No sore throat. Cardiovascular: Denies chest pain. Respiratory: Denies shortness of breath. Gastrointestinal: Positive for abdominal pain. Positive for nausea and vomiting.  Genitourinary: Negative for dysuria. Musculoskeletal: Negative for back pain. Skin: Negative for rash. Neurological: Negative for headaches, focal weakness or numbness.  ____________________________________________   PHYSICAL EXAM:  VITAL SIGNS: ED Triage Vitals [08/11/17 2212]  Enc Vitals Group     BP 124/90     Pulse Rate 78  Resp 18     Temp 97.7 F (36.5 C)     Temp Source Oral     SpO2 100 %     Weight 190 lb (86.2 kg)     Height 5\' 10"  (1.778 m)     Head Circumference      Peak Flow      Pain Score 10   Constitutional: Alert and oriented. Well appearing and in no distress. Eyes: Conjunctivae are normal.  ENT   Head: Normocephalic and atraumatic.   Nose: No congestion/rhinnorhea.   Mouth/Throat: Mucous membranes are moist.   Neck: No  stridor. Hematological/Lymphatic/Immunilogical: No cervical lymphadenopathy. Cardiovascular: Normal rate, regular rhythm.  No murmurs, rubs, or gallops.  Respiratory: Normal respiratory effort without tachypnea nor retractions. Breath sounds are clear and equal bilaterally. No wheezes/rales/rhonchi. Gastrointestinal: Soft and non tender. No rebound. No guarding.  Genitourinary: Deferred Musculoskeletal: Normal range of motion in all extremities. No lower extremity edema. Neurologic:  Normal speech and language. No gross focal neurologic deficits are appreciated.  Skin:  Skin is warm, dry and intact. No rash noted. Psychiatric: Mood and affect are normal. Speech and behavior are normal. Patient exhibits appropriate insight and judgment.  ____________________________________________    LABS (pertinent positives/negatives)  Pending  ____________________________________________   EKG  None  ____________________________________________    RADIOLOGY  None  ____________________________________________   PROCEDURES  Procedures  ____________________________________________   INITIAL IMPRESSION / ASSESSMENT AND PLAN / ED COURSE  Pertinent labs & imaging results that were available during my care of the patient were reviewed by me and considered in my medical decision making (see chart for details).  Patient presented to the emergency department today because of concerns for acute on chronic pancreatitis.  Does have a long history of pancreatitis.  Will plan on checking blood work.  Will start with nonnarcotic pain medications given chronic nature of the patient's symptoms.  Will check blood work.  Patient did have a recent EGD however that was 9 days ago.  Do think would be less likely to have a complication from that this far out however if abnormalities and blood work suggest imaging can be ordered.  ____________________________________________   FINAL CLINICAL  IMPRESSION(S) / ED DIAGNOSES  Abdominal pain  Note: This dictation was prepared with Dragon dictation. Any transcriptional errors that result from this process are unintentional     Phineas SemenGoodman, Naeema Patlan, MD 08/11/17 2320

## 2017-08-11 NOTE — ED Notes (Signed)
Pt to the er for chronic pancreatitis and states his pain has flared up. Pt is set to follow up with pain clinic. Pt is c/o pain in his flank and his back. Pt reports diarrhea. Pt reports vomiting. Denies blood. Pt reports ETOH or drug use.

## 2017-08-12 ENCOUNTER — Emergency Department: Payer: Medicare Other

## 2017-08-12 DIAGNOSIS — R1010 Upper abdominal pain, unspecified: Secondary | ICD-10-CM | POA: Diagnosis not present

## 2017-08-12 MED ORDER — IOPAMIDOL (ISOVUE-300) INJECTION 61%
100.0000 mL | Freq: Once | INTRAVENOUS | Status: AC | PRN
  Administered 2017-08-12: 100 mL via INTRAVENOUS

## 2017-08-12 MED ORDER — FENTANYL CITRATE (PF) 100 MCG/2ML IJ SOLN
50.0000 ug | Freq: Once | INTRAMUSCULAR | Status: AC
Start: 2017-08-12 — End: 2017-08-12
  Administered 2017-08-12: 50 ug via INTRAVENOUS

## 2017-08-12 MED ORDER — OXYCODONE-ACETAMINOPHEN 5-325 MG PO TABS: 1.0000 | ORAL_TABLET | ORAL | 0 refills | Status: DC | PRN

## 2017-08-12 MED ORDER — ONDANSETRON 4 MG PO TBDP: 4.0000 mg | ORAL_TABLET | Freq: Three times a day (TID) | ORAL | 0 refills | Status: DC | PRN

## 2017-08-12 MED ORDER — POTASSIUM CHLORIDE CRYS ER 20 MEQ PO TBCR
40.0000 meq | EXTENDED_RELEASE_TABLET | Freq: Once | ORAL | Status: AC
  Administered 2017-08-12: 40 meq via ORAL

## 2017-08-12 MED ORDER — IOPAMIDOL (ISOVUE-300) INJECTION 61%
30.0000 mL | Freq: Once | INTRAVENOUS | Status: AC
  Administered 2017-08-12: 30 mL via ORAL

## 2017-08-12 MED ORDER — FENTANYL CITRATE (PF) 100 MCG/2ML IJ SOLN
INTRAMUSCULAR | Status: AC
  Filled 2017-08-12: qty 2

## 2017-08-12 MED ORDER — POTASSIUM CHLORIDE CRYS ER 20 MEQ PO TBCR
EXTENDED_RELEASE_TABLET | ORAL | Status: AC
  Filled 2017-08-12: qty 2

## 2017-08-12 NOTE — ED Notes (Signed)
Patient transported to CT 

## 2017-08-12 NOTE — Discharge Instructions (Signed)
1.  You may take pain and nausea medicines as needed (Percocet/Zofran #15). 2.  Clear liquids for 12 hours, then bland diet for 3 days, then slowly advance diet as tolerated. 3.  Return to the ER for worsening symptoms, persistent vomiting, difficulty breathing or other concerns.

## 2017-08-12 NOTE — ED Provider Notes (Signed)
-----------------------------------------   12:28 AM on 08/12/2017 -----------------------------------------  Patient requesting something for pain.  Updated him of laboratory results.  Normal white count, mild hypokalemia, mildly elevated lipase which is stable from baseline.  Given patient's recent instrumentation, will obtain CT abdomen/pelvis to evaluate for complications.   ----------------------------------------- 2:31 AM on 08/12/2017 -----------------------------------------  CT abdomen pelvis interpreted per Dr. Kearney Hardover: Changes of chronic pancreatitis with scattered calcifications in the  pancreatic head and body.    Slight indistinctness and haziness around the pancreatic head/neck  region could reflect early acute pancreatitis.    Prior cholecystectomy. Pneumobilia, presumably related to prior  sphincterotomy. This is stable.    Patient asleep.  Updated patient and spouse of CT imaging results.  He is feeling significantly better.  No vomiting while in the ED.  Will discharge home with analgesia, antiemetic and patient will follow up with his PCP closely.  Strict return precautions given.  Both verbalize understanding and agree with plan of care.   Irean HongSung, Halen Mossbarger J, MD 08/12/17 [id number]

## 2017-08-12 NOTE — ED Notes (Signed)
Pt sleeping on this RN entering room. Asked about pain, pt reports it had eased off and was an 8/10.

## 2017-11-09 ENCOUNTER — Telehealth: Payer: Self-pay

## 2017-11-09 NOTE — Telephone Encounter (Signed)
Gastroenterology Pre-Procedure Review  Request Date: 11/23/17   Requesting Physician: Dr. Maximino Greenland     PATIENT REVIEW QUESTIONS: The patient responded to the following health history questions as indicated:    1. Are you having any GI issues? No  2. Do you have a personal history of Polyps? No  3. Do you have a family history of Colon Cancer or Polyps? No  4. Diabetes Mellitus? No  5. Joint replacements in the past 12 months? No  6. Major health problems in the past 3 months? No  7. Any artificial heart valves, MVP, or defibrillator? No     MEDICATIONS & ALLERGIES:    Patient reports the following regarding taking any anticoagulation/antiplatelet therapy:   Plavix, Coumadin, Eliquis, Xarelto, Lovenox, Pradaxa, Brilinta, or Effient? No  Aspirin? Yes, 81 mg   Patient confirms/reports the following medications:  Current Outpatient Medications  Medication Sig Dispense Refill  . aspirin EC 81 MG tablet Take 81 mg by mouth daily.    Marland Kitchen lisinopril-hydrochlorothiazide (PRINZIDE,ZESTORETIC) 20-12.5 MG tablet Take 1 tablet by mouth daily.     No current facility-administered medications for this visit.     Patient confirms/reports the following allergies:  No Known Allergies  No orders of the defined types were placed in this encounter.   AUTHORIZATION INFORMATION Primary Insurance: 1D#: Group #:  Secondary Insurance: 1D#: Group #:  SCHEDULE INFORMATION: Date: 11/23/17 Time: Location: [facility]

## 2017-11-10 ENCOUNTER — Other Ambulatory Visit: Payer: Self-pay

## 2017-11-10 DIAGNOSIS — Z1211 Encounter for screening for malignant neoplasm of colon: Secondary | ICD-10-CM

## 2017-11-22 ENCOUNTER — Encounter: Payer: Self-pay | Admitting: Student

## 2017-11-23 ENCOUNTER — Encounter: Payer: Self-pay | Admitting: *Deleted

## 2017-11-23 ENCOUNTER — Ambulatory Visit: Payer: Medicare Other | Admitting: Certified Registered"

## 2017-11-23 ENCOUNTER — Encounter
Admission: RE | Disposition: A | Discharge status: Home / Self Care | Payer: Self-pay | Source: Ambulatory Visit | Attending: Gastroenterology

## 2017-11-23 ENCOUNTER — Ambulatory Visit
Admission: RE | Admit: 2017-11-23 | Discharge: 2017-11-23 | Disposition: A | Discharge status: Home / Self Care | Payer: Medicare Other | Source: Ambulatory Visit | Attending: Gastroenterology | Admitting: Gastroenterology

## 2017-11-23 DIAGNOSIS — Z1211 Encounter for screening for malignant neoplasm of colon: Secondary | ICD-10-CM | POA: Insufficient documentation

## 2017-11-23 DIAGNOSIS — Z5309 Procedure and treatment not carried out because of other contraindication: Secondary | ICD-10-CM | POA: Diagnosis not present

## 2017-11-23 DIAGNOSIS — F1490 Cocaine use, unspecified, uncomplicated: Secondary | ICD-10-CM | POA: Insufficient documentation

## 2017-11-23 LAB — URINE DRUG SCREEN, QUALITATIVE (ARMC ONLY)
Amphetamines, Ur Screen: NOT DETECTED
Barbiturates, Ur Screen: NOT DETECTED
Benzodiazepine, Ur Scrn: NOT DETECTED
Cannabinoid 50 Ng, Ur ~~LOC~~: POSITIVE — AB
Cocaine Metabolite,Ur ~~LOC~~: POSITIVE — AB
MDMA (Ecstasy)Ur Screen: NOT DETECTED
Methadone Scn, Ur: NOT DETECTED
Opiate, Ur Screen: NOT DETECTED
Phencyclidine (PCP) Ur S: NOT DETECTED
Tricyclic, Ur Screen: NOT DETECTED

## 2017-11-23 SURGERY — COLONOSCOPY WITH PROPOFOL
Anesthesia: General

## 2017-11-23 MED ORDER — SODIUM CHLORIDE 0.9 % IV SOLN: INTRAVENOUS | Status: DC

## 2017-11-23 MED ORDER — LIDOCAINE HCL (PF) 2 % IJ SOLN
INTRAMUSCULAR | Status: AC
  Filled 2017-11-23: qty 10

## 2017-11-23 MED ORDER — GLYCOPYRROLATE 0.2 MG/ML IJ SOLN
INTRAMUSCULAR | Status: AC
  Filled 2017-11-23: qty 1

## 2017-11-23 MED ORDER — PROPOFOL 500 MG/50ML IV EMUL
INTRAVENOUS | Status: AC
  Filled 2017-11-23: qty 50

## 2017-11-23 NOTE — OR Nursing (Signed)
Pt urine tested positive for cocaine.  Procedure cancelled.  Pt councillied by Dr Audrie Lia Karins.

## 2017-11-23 NOTE — H&P (Addendum)
Please note that patient did not have his procedure today as he was positive for Cocaine and anesthesia recommended cancelling the procedure. I did not see or talk to the patient before he was discharged by anesthesia MD, Dr. Wyn Quakerew, and by the endo unit staff.

## 2018-11-09 ENCOUNTER — Other Ambulatory Visit: Payer: Self-pay

## 2018-11-09 ENCOUNTER — Encounter: Payer: Self-pay | Admitting: Emergency Medicine

## 2018-11-09 ENCOUNTER — Emergency Department: Payer: Medicare Other

## 2018-11-09 ENCOUNTER — Emergency Department
Admission: EM | Admit: 2018-11-09 | Discharge: 2018-11-09 | Disposition: A | Discharge status: Home / Self Care | Payer: Medicare Other | Attending: Emergency Medicine | Admitting: Emergency Medicine

## 2018-11-09 DIAGNOSIS — Z7982 Long term (current) use of aspirin: Secondary | ICD-10-CM | POA: Diagnosis not present

## 2018-11-09 DIAGNOSIS — F17210 Nicotine dependence, cigarettes, uncomplicated: Secondary | ICD-10-CM | POA: Insufficient documentation

## 2018-11-09 DIAGNOSIS — Y9389 Activity, other specified: Secondary | ICD-10-CM | POA: Diagnosis not present

## 2018-11-09 DIAGNOSIS — S93401A Sprain of unspecified ligament of right ankle, initial encounter: Secondary | ICD-10-CM | POA: Insufficient documentation

## 2018-11-09 DIAGNOSIS — W01198A Fall on same level from slipping, tripping and stumbling with subsequent striking against other object, initial encounter: Secondary | ICD-10-CM | POA: Insufficient documentation

## 2018-11-09 DIAGNOSIS — Y9289 Other specified places as the place of occurrence of the external cause: Secondary | ICD-10-CM | POA: Insufficient documentation

## 2018-11-09 DIAGNOSIS — S8991XA Unspecified injury of right lower leg, initial encounter: Secondary | ICD-10-CM | POA: Diagnosis present

## 2018-11-09 DIAGNOSIS — S8391XA Sprain of unspecified site of right knee, initial encounter: Secondary | ICD-10-CM | POA: Diagnosis not present

## 2018-11-09 DIAGNOSIS — I10 Essential (primary) hypertension: Secondary | ICD-10-CM | POA: Diagnosis not present

## 2018-11-09 DIAGNOSIS — Z79899 Other long term (current) drug therapy: Secondary | ICD-10-CM | POA: Insufficient documentation

## 2018-11-09 DIAGNOSIS — Y999 Unspecified external cause status: Secondary | ICD-10-CM | POA: Insufficient documentation

## 2018-11-09 MED ORDER — NAPROXEN 500 MG PO TABS: 500.0000 mg | ORAL_TABLET | Freq: Two times a day (BID) | ORAL | 0 refills | Status: DC

## 2018-11-09 NOTE — Discharge Instructions (Signed)
Follow up with orthopedics for symptoms that are not improving over the next several days.   Rest, ice, and elevate your leg as much as possible over the next few days.   Return to the ER for symptoms that change or worsen if unable to schedule an appointment.

## 2018-11-09 NOTE — ED Triage Notes (Signed)
Pt c/o right knee pain after slipping and falling on ramp yesterday.  No obvious deformity. Was ambulatory in, placed in wheel chair.

## 2018-11-09 NOTE — ED Notes (Signed)
See triage note  States he slipped and fell last pm  Landed on right knee and twisted right ankle  No deformity  Noted  Good pulses

## 2018-11-09 NOTE — ED Provider Notes (Signed)
[facility] Emergency Department Provider Note ____________________________________________  Time seen: Approximately 12:16 PM  I have reviewed the triage vital signs and the nursing notes.   HISTORY  Chief Complaint Knee Pain    HPI Walter [name] is a 53 y.o. male who presents to the emergency department for evaluation and treatment of right knee and ankle pain after slipping on a wet ramp last night. He landed on the knee and twisted his ankle. No alleviating measures prior to arrival. No previous knee or ankle injuries.  Past Medical History:  Diagnosis Date  . Bipolar 1 disorder (HCC)   . Hypertension   . Pancreatitis   . PTSD (post-traumatic stress disorder)     Patient Active Problem List   Diagnosis Date Noted  . Cannabis use disorder, moderate, dependence (HCC) 08/19/2015  . Substance induced mood disorder (HCC) 08/18/2015  . Tobacco use disorder 02/17/2015  . Pancreatitis, chronic (HCC) 02/17/2015  . HTN (hypertension) 02/17/2015  . Alcohol withdrawal (HCC) 02/17/2015  . PTSD (post-traumatic stress disorder) 02/17/2015  . Alcohol use disorder, severe, dependence (HCC) 02/16/2015  . Cocaine use disorder, moderate, dependence (HCC) 02/16/2015    Past Surgical History:  Procedure Laterality Date  . CHOLECYSTECTOMY    . PANCREAS SURGERY      Prior to Admission medications   Medication Sig Start Date End Date Taking? Authorizing Provider  aspirin EC 81 MG tablet Take 81 mg by mouth daily.    [provider]  lisinopril-hydrochlorothiazide (PRINZIDE,ZESTORETIC) 20-12.5 MG tablet Take 1 tablet by mouth daily.    [provider]  naproxen (NAPROSYN) 500 MG tablet Take 1 tablet (500 mg total) by mouth 2 (two) times daily with a meal. 11/09/18   Terese Heier B, FNP    Allergies Patient has no known allergies.  History reviewed. No pertinent family history.  Social History Social History   Tobacco Use  . Smoking  status: Current Every Day Smoker    Packs/day: 0.50    Years: 20.00    Pack years: 10.00    Types: Cigarettes  . Smokeless tobacco: Never Used  Substance Use Topics  . Alcohol use: Yes    Alcohol/week: 12.0 standard drinks    Types: 12 Cans of beer per week    Comment: 1/2 of a 5th a day  . Drug use: Yes    Types: Cocaine, Marijuana    Comment: uses daily    Review of Systems Constitutional: Negative for fever. Cardiovascular: Negative for chest pain. Respiratory: Negative for shortness of breath. Musculoskeletal: Positive for right knee and ankle pain Skin: Negative for open wound or lesions.  Neurological: Negative for decrease in sensation  ____________________________________________   PHYSICAL EXAM:  VITAL SIGNS: ED Triage Vitals  Enc Vitals Group     BP 11/09/18 1129 111/62     Pulse Rate 11/09/18 1129 86     Resp 11/09/18 1129 18     Temp 11/09/18 1129 97.9 F (36.6 C)     Temp Source 11/09/18 1129 Oral     SpO2 11/09/18 1129 98 %     Weight 11/09/18 1128 178 lb (80.7 kg)     Height 11/09/18 1128 5\' 10"  (1.778 m)     Head Circumference --      Peak Flow --      Pain Score 11/09/18 1128 10     Pain Loc --      Pain Edu? --      Excl. in GC? --  Constitutional: Alert and oriented. Well appearing and in no acute distress. Eyes: Conjunctivae are clear without discharge or drainage Head: Atraumatic Neck: Supple Respiratory: No cough. Respirations are even and unlabored. Musculoskeletal: Right knee pain with flexion. Straight leg raise demonstrated. No focal bony deformity. Right ankle diffusely tender without edema. Decreased ROM due to pain. Neurologic: Motor and sensory function is intact.  Skin: No wounds, contusions, or lesions over right knee or ankle.  Psychiatric: Affect and behavior are appropriate.  ____________________________________________   LABS (all labs ordered are listed, but only abnormal results are displayed)  Labs Reviewed - No  data to display ____________________________________________  RADIOLOGY  Images of the right knee and ankle are negative for acute bony abnormality per radiology.  ____________________________________________   PROCEDURES  .Ortho Injury Treatment Date/Time: 11/11/2018 7:09 AM Performed by: Chinita Pesterriplett, Adalin Vanderploeg B, FNP Authorized by: Chinita Pesterriplett, Leman Martinek B, FNP   Consent:    Consent obtained:  Verbal   Consent given by:  Patient   Risks discussed:  StiffnessInjury location: knee Location details: right knee Pre-procedure neurovascular assessment: neurovascularly intact Immobilization: Cotton padding and ACE. Post-procedure neurovascular assessment: post-procedure neurovascularly intact  .Ortho Injury Treatment Date/Time: 11/11/2018 7:10 AM Performed by: Chinita Pesterriplett, Rushi Chasen B, FNP Authorized by: Chinita Pesterriplett, Jeremaih Klima B, FNP   Consent:    Consent obtained:  Verbal   Consent given by:  Patient   Risks discussed:  Stiffness and restricted joint movementInjury location: ankle Location details: right ankle Pre-procedure neurovascular assessment: neurovascularly intact Immobilization: brace Splint type: ankle stirrup Post-procedure neurovascular assessment: post-procedure neurovascularly intact Comments: Prefabricated velcro splint     ____________________________________________   INITIAL IMPRESSION / ASSESSMENT AND PLAN / ED COURSE  Walter [name] is a 53 y.o. who presents to the emergency department for evaluation of right knee and ankle pain after a mechanical, non-syncopal fall yesterday. Images and exam are reassuring. Jones wrap and ankle stirrup splint applied as above.  Patient instructed to follow-up with orthopedics if not improving over the week.  He was also instructed to return to the emergency department for symptoms that change or worsen if unable schedule an appointment with orthopedics or primary care.  Medications - No data to display  Pertinent labs & imaging results that  were available during my care of the patient were reviewed by me and considered in my medical decision making (see chart for details).  _________________________________________   FINAL CLINICAL IMPRESSION(S) / ED DIAGNOSES  Final diagnoses:  Sprain of right knee, unspecified ligament, initial encounter  Sprain of right ankle, unspecified ligament, initial encounter    ED Discharge Orders         Ordered    naproxen (NAPROSYN) 500 MG tablet  2 times daily with meals     11/09/18 1304           If controlled substance prescribed during this visit, 12 month history viewed on the [facility] prior to issuing an initial prescription for Schedule II or III opiod.   Chinita Pesterriplett, Zuleyka Kloc B, FNP 11/11/18 [id number]    Sharman CheekStafford, Phillip, MD 11/11/18 [id number]

## 2019-07-23 IMAGING — DX RIGHT ANKLE - COMPLETE 3+ VIEW
3 series · 3 of 3 positions shown · non-contrast
Comparison: None.

CLINICAL DATA: Right ankle pain after fall yesterday.

EXAM:
RIGHT ANKLE - COMPLETE 3+ VIEW

[ankle ap]
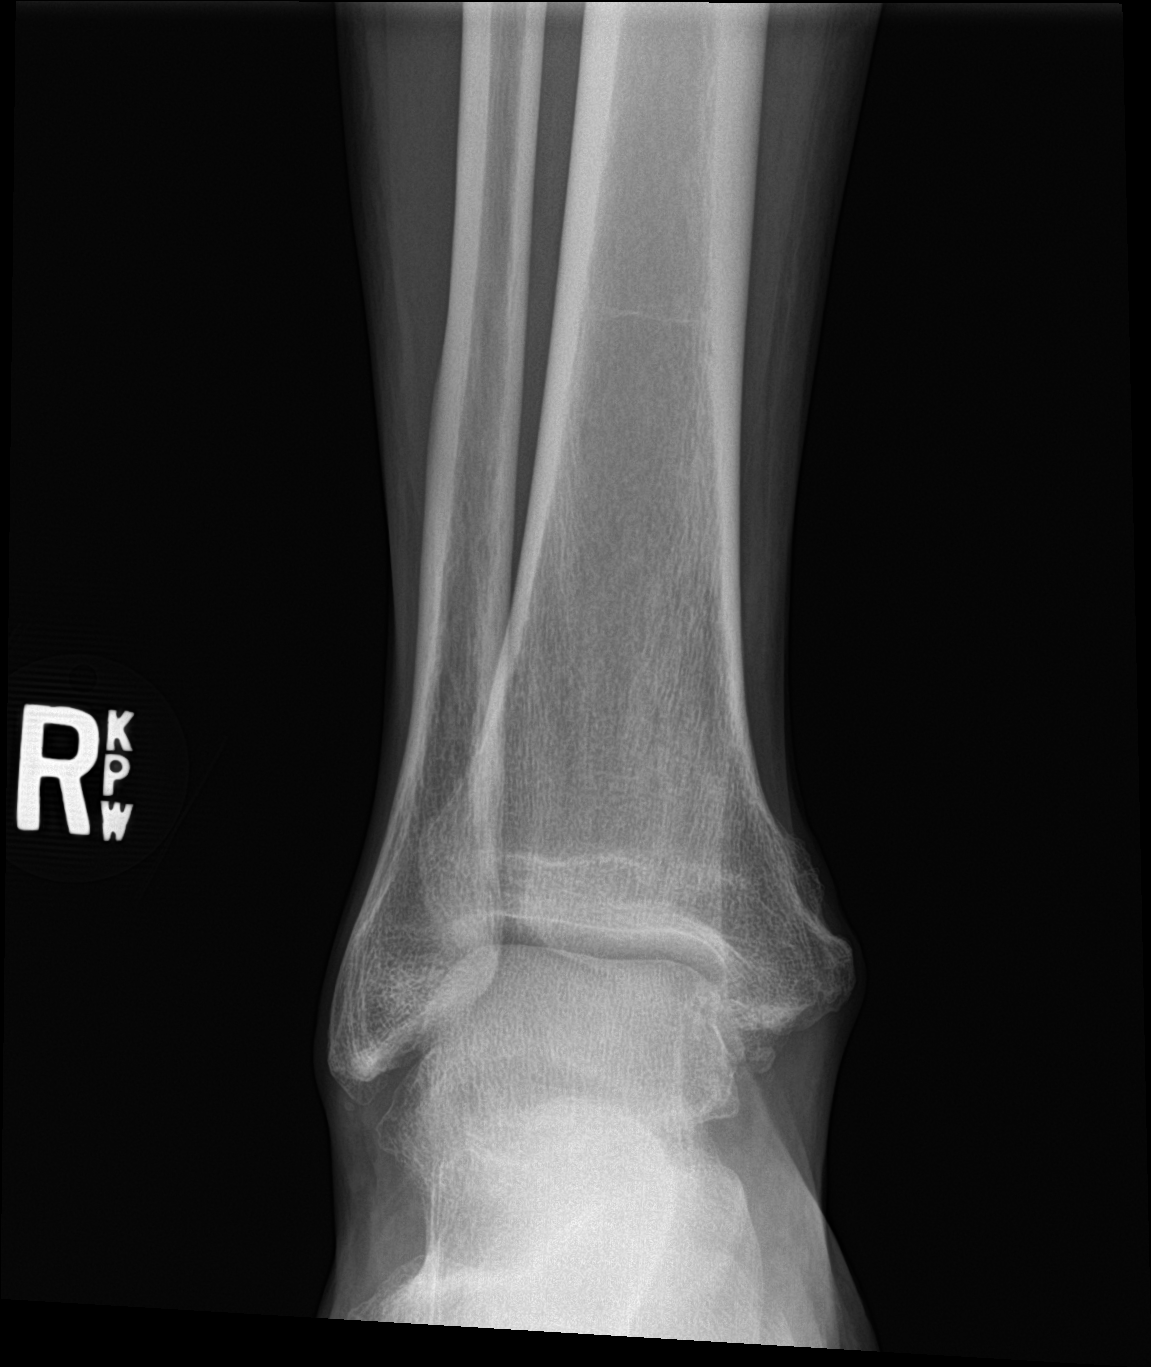

[ankle obl]
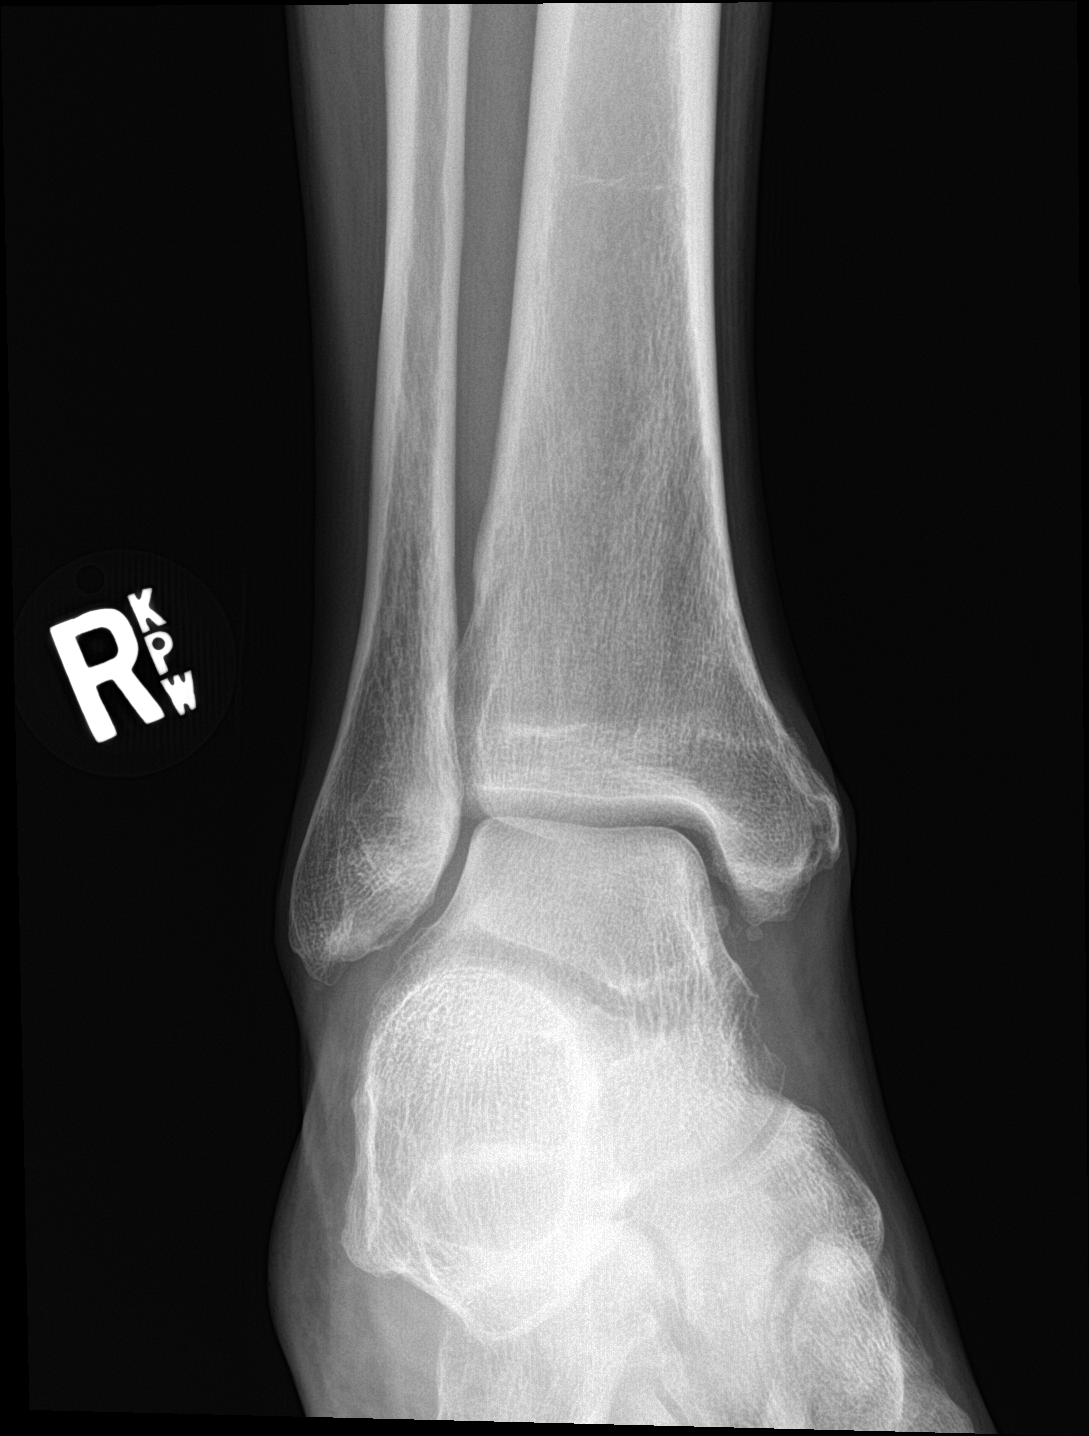

[ankle lat]
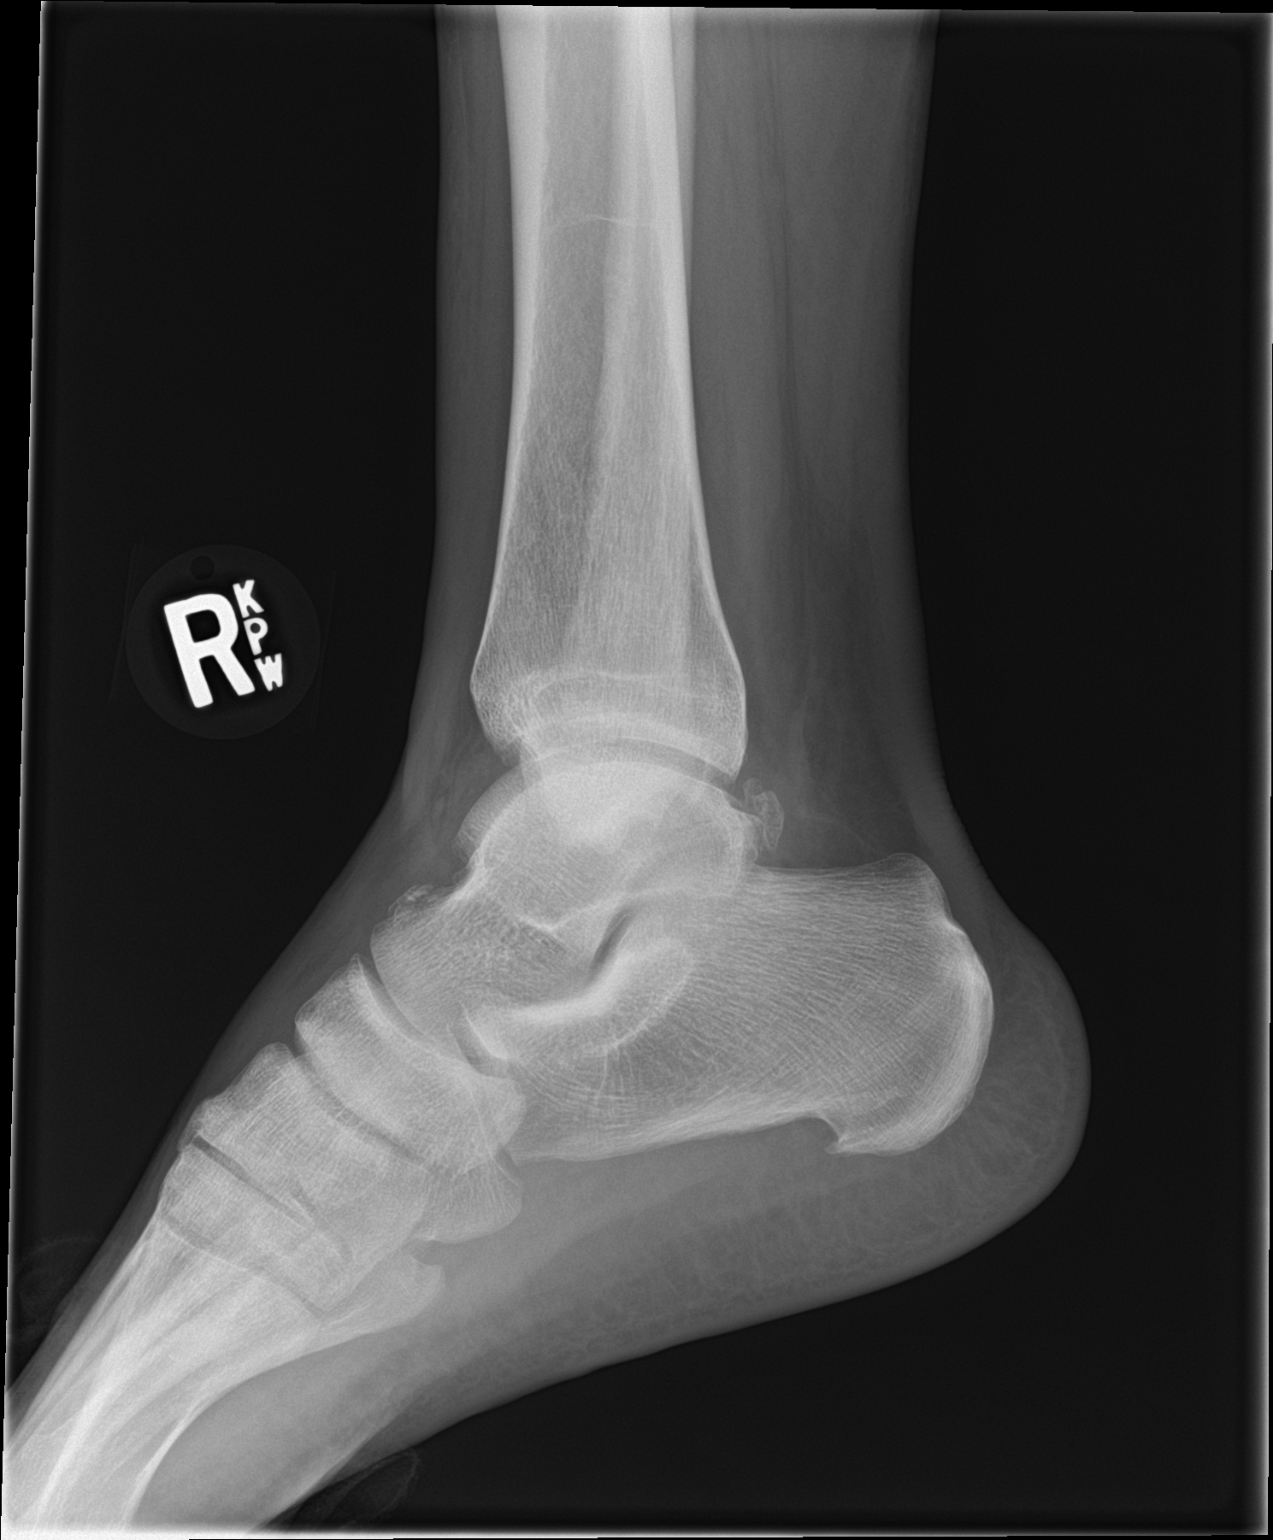

[3 of 3 positions shown; findings below may reference images not displayed]

FINDINGS: There is no evidence of fracture, dislocation, or joint effusion.
There is no evidence of arthropathy or other focal bone abnormality.
Soft tissues are unremarkable.
IMPRESSION: Negative.

## 2019-07-24 ENCOUNTER — Ambulatory Visit: Payer: Self-pay | Admitting: Podiatry

## 2019-10-02 ENCOUNTER — Ambulatory Visit (INDEPENDENT_AMBULATORY_CARE_PROVIDER_SITE_OTHER): Payer: Medicare HMO | Admitting: Podiatry

## 2019-10-02 ENCOUNTER — Encounter: Payer: Self-pay | Admitting: Podiatry

## 2019-10-02 ENCOUNTER — Ambulatory Visit (INDEPENDENT_AMBULATORY_CARE_PROVIDER_SITE_OTHER): Payer: Medicare HMO

## 2019-10-02 ENCOUNTER — Other Ambulatory Visit: Payer: Self-pay

## 2019-10-02 VITALS — Temp 97.8°F

## 2019-10-02 DIAGNOSIS — M2011 Hallux valgus (acquired), right foot: Secondary | ICD-10-CM

## 2019-10-02 DIAGNOSIS — M2010 Hallux valgus (acquired), unspecified foot: Secondary | ICD-10-CM

## 2019-10-02 DIAGNOSIS — M898X7 Other specified disorders of bone, ankle and foot: Secondary | ICD-10-CM | POA: Diagnosis not present

## 2019-10-02 NOTE — Patient Instructions (Signed)
Pre-Operative Instructions  Congratulations, you have decided to take an important step towards improving your quality of life.  You can be assured that the doctors and staff at [facility] will be with you every step of the way.  Here are some important things you should know:  1. Plan to be at the surgery center/hospital at least 1 (one) hour prior to your scheduled time, unless otherwise directed by the surgical center/hospital staff.  You must have a responsible adult accompany you, remain during the surgery and drive you home.  Make sure you have directions to the surgical center/hospital to ensure you arrive on time. 2. If you are having surgery at [facility] or [facility], you will need a copy of your medical history and physical form from your family physician within one month prior to the date of surgery. We will give you a form for your primary physician to complete.  3. We make every effort to accommodate the date you request for surgery.  However, there are times where surgery dates or times have to be moved.  We will contact you as soon as possible if a change in schedule is required.   4. No aspirin/ibuprofen for one week before surgery.  If you are on aspirin, any non-steroidal anti-inflammatory medications (Mobic, Aleve, Ibuprofen) should not be taken seven (7) days prior to your surgery.  You make take Tylenol for pain prior to surgery.  5. Medications - If you are taking daily heart and blood pressure medications, seizure, reflux, allergy, asthma, anxiety, pain or diabetes medications, make sure you notify the surgery center/hospital before the day of surgery so they can tell you which medications you should take or avoid the day of surgery. 6. No food or drink after midnight the night before surgery unless directed otherwise by surgical center/hospital staff. 7. No alcoholic beverages 24-hours prior to surgery.  No smoking 24-hours prior or 24-hours after  surgery. 8. Wear loose pants or shorts. They should be loose enough to fit over bandages, boots, and casts. 9. Don't wear slip-on shoes. Sneakers are preferred. 10. Bring your boot with you to the surgery center/hospital.  Also bring crutches or a walker if your physician has prescribed it for you.  If you do not have this equipment, it will be provided for you after surgery. 11. If you have not been contacted by the surgery center/hospital by the day before your surgery, call to confirm the date and time of your surgery. 12. Leave-time from work may vary depending on the type of surgery you have.  Appropriate arrangements should be made prior to surgery with your employer. 13. Prescriptions will be provided immediately following surgery by your doctor.  Fill these as soon as possible after surgery and take the medication as directed. Pain medications will not be refilled on weekends and must be approved by the doctor. 14. Remove nail polish on the operative foot and avoid getting pedicures prior to surgery. 15. Wash the night before surgery.  The night before surgery wash the foot and leg well with water and the antibacterial soap provided. Be sure to pay special attention to beneath the toenails and in between the toes.  Wash for at least three (3) minutes. Rinse thoroughly with water and dry well with a towel.  Perform this wash unless told not to do so by your physician.  Enclosed: 1 Ice pack (please put in freezer the night before surgery)   1 Hibiclens skin cleaner     Pre-op instructions  If you have any questions regarding the instructions, please do not hesitate to call our office.  [place]: [address], [place], [place] [address] -- [phone]  [place]: [address] [place], [place] [address] -- [phone]  [place]: [address], [place], [place] [address] -- [phone]   Website: [email or web address] 

## 2019-10-05 ENCOUNTER — Telehealth: Payer: Self-pay

## 2019-10-05 NOTE — Telephone Encounter (Signed)
DOS 10/25/2019  DOUBLE OSTEOTOMY RT - 28299 EXOSTECTOMY 2ND RT - 28108  [employer] MEDICARE EFFECTIVE DATE - 07/16/2019  PLAN DEDUCTIBLE - $203.00 W/ $169.62 REMAINING OUT OF POCKET - $0.00 COPAY $0.00 COINSURANCE - 80%  PER AUTOMATED SYSTEM NO PRECERT REQUIRED FOR CPT 28299 & 28108. REF# [id number]

## 2019-10-05 NOTE — Progress Notes (Signed)
   Subjective: 54 y.o. male presents today as a new patient with a chief complaint of soreness of the right 2nd toe that began 2-3 years ago after dropping a big can of vegetables on it. He also reports intermittent throbbing, aching burning pain of bilateral great toes secondary to bunions that have been present for the past several years. Touching the areas and walking increases the pain. He has been taking Ibuprofen and icing the areas for treatment. Patient is here for further evaluation and treatment.   Past Medical History:  Diagnosis Date  . Bipolar 1 disorder (HCC)   . Hypertension   . Pancreatitis   . PTSD (post-traumatic stress disorder)     Objective: Physical Exam General: The patient is alert and oriented x3 in no acute distress.  Dermatology: Skin is cool, dry and supple bilateral lower extremities. Negative for open lesions or macerations.  Vascular: Palpable pedal pulses bilaterally. No edema or erythema noted. Capillary refill within normal limits.  Neurological: Epicritic and protective threshold grossly intact bilaterally.   Musculoskeletal Exam: Clinical evidence of bunion deformity noted to the respective foot. There is moderate pain on palpation range of motion of the first MPJ. Lateral deviation of the hallux noted consistent with hallux abductovalgus. Pain with palpation noted to the right 2nd toe.   Radiographic Exam: Increased intermetatarsal angle greater than 15 with a hallux abductus angle greater than 30 noted on AP view. Moderate degenerative changes noted within the first MPJ. Subungual exostosis noted to the right 2nd toe.   Assessment: 1. HAV w/ bunion deformity bilateral, right greater than left  2. Subungual exostosis right 2nd toe     Plan of Care:  1. Patient was evaluated. X-Rays reviewed. 2. Today we discussed the conservative versus surgical management of the presenting pathology. The patient opts for surgical management. All possible  complications and details of the procedure were explained. All patient questions were answered. No guarantees were expressed or implied. 3. Authorization for surgery was initiated today. Surgery will consist of bunionectomy with osteotomy right; exostectomy right 2nd toe distal phalanx.  4. Return to clinic one week post op.   On disability.    Felecia Shelling, DPM [facility]  Dr. Felecia Shelling, DPM    [address]                                        [place], [place] [address]                Office [phone]  Fax [phone]

## 2019-10-23 ENCOUNTER — Telehealth: Payer: Self-pay | Admitting: *Deleted

## 2019-10-23 DIAGNOSIS — Z87891 Personal history of nicotine dependence: Secondary | ICD-10-CM

## 2019-10-23 DIAGNOSIS — Z122 Encounter for screening for malignant neoplasm of respiratory organs: Secondary | ICD-10-CM

## 2019-10-23 NOTE — Telephone Encounter (Signed)
Received referral for initial lung cancer screening scan. Contacted patient and obtained smoking history,(current, 40 pack year) as well as answering questions related to screening process. Patient denies signs of lung cancer such as weight loss or hemoptysis. Patient denies comorbidity that would prevent curative treatment if lung cancer were found. Patient is scheduled for shared decision making visit and CT scan on 10/30/19 at 145pm.

## 2019-10-25 ENCOUNTER — Telehealth: Payer: Self-pay | Admitting: *Deleted

## 2019-10-25 ENCOUNTER — Other Ambulatory Visit: Payer: Self-pay | Admitting: Podiatry

## 2019-10-25 DIAGNOSIS — M2570 Osteophyte, unspecified joint: Secondary | ICD-10-CM | POA: Diagnosis not present

## 2019-10-25 DIAGNOSIS — M2011 Hallux valgus (acquired), right foot: Secondary | ICD-10-CM | POA: Diagnosis not present

## 2019-10-25 MED ORDER — MELOXICAM 15 MG PO TABS: 15.0000 mg | ORAL_TABLET | Freq: Every day | ORAL | 1 refills | Status: DC

## 2019-10-25 MED ORDER — OXYCODONE-ACETAMINOPHEN 5-325 MG PO TABS: 1.0000 | ORAL_TABLET | Freq: Four times a day (QID) | ORAL | 0 refills | Status: DC | PRN

## 2019-10-25 NOTE — Telephone Encounter (Signed)
Zella Ball w/ [facility] is requesting clarification on a prescription sent in this morning. She would like to know if the medication is Post surgical or acute. If it is post surgical, the most that they can fill is a 7 day supply not 8 and if acute may fill 5 days only.Please call to confirm.

## 2019-10-25 NOTE — Telephone Encounter (Signed)
Post surgical. Patient just had surgery this morning. Please fill a 7 day supply. - Dr. Logan Bores

## 2019-10-25 NOTE — Progress Notes (Signed)
PRN postop 

## 2019-10-25 NOTE — Telephone Encounter (Signed)
Called and verified prescription with [facility] per Dr. Logan Bores instructions

## 2019-10-30 ENCOUNTER — Inpatient Hospital Stay: Payer: Medicare HMO | Admitting: Oncology

## 2019-10-30 ENCOUNTER — Ambulatory Visit: Payer: Medicare HMO

## 2019-11-02 ENCOUNTER — Ambulatory Visit (INDEPENDENT_AMBULATORY_CARE_PROVIDER_SITE_OTHER): Payer: Medicare HMO | Admitting: Podiatry

## 2019-11-02 ENCOUNTER — Ambulatory Visit (INDEPENDENT_AMBULATORY_CARE_PROVIDER_SITE_OTHER): Payer: Medicare HMO

## 2019-11-02 ENCOUNTER — Other Ambulatory Visit: Payer: Self-pay

## 2019-11-02 DIAGNOSIS — Z9889 Other specified postprocedural states: Secondary | ICD-10-CM | POA: Diagnosis not present

## 2019-11-02 DIAGNOSIS — M2010 Hallux valgus (acquired), unspecified foot: Secondary | ICD-10-CM

## 2019-11-02 DIAGNOSIS — M2011 Hallux valgus (acquired), right foot: Secondary | ICD-10-CM

## 2019-11-02 MED ORDER — OXYCODONE-ACETAMINOPHEN 5-325 MG PO TABS: 1.0000 | ORAL_TABLET | Freq: Four times a day (QID) | ORAL | 0 refills | Status: DC | PRN

## 2019-11-05 NOTE — Progress Notes (Signed)
   Subjective:  Patient presents today status post bunionectomy right; exostectomy right 2nd toe. DOS: 10/25/2019. He states he is doing well overall. He has been taking Percocet for treatment of any pain he may have which helps alleviate it. He has been using the CAM boot as directed. There are no worsening factors noted. Patient is here for further evaluation and treatment.   Past Medical History:  Diagnosis Date  . Bipolar 1 disorder (HCC)   . Hypertension   . Pancreatitis   . PTSD (post-traumatic stress disorder)       Objective/Physical Exam Neurovascular status intact.  Skin incisions appear to be well coapted with sutures and staples intact. No sign of infectious process noted. No dehiscence. No active bleeding noted. Moderate edema noted to the surgical extremity.  Radiographic Exam:  Orthopedic hardware and osteotomies sites appear to be stable with routine healing.  Assessment: 1. s/p bunionectomy right; exostectomy right 2nd toe. DOS: 10/25/2019   Plan of Care:  1. Patient was evaluated. X-rays reviewed 2. Dressing changed.  3. Continue weightbearing in CAM boot.  4. Refill prescription for Percocet 5/325 mg provided to patient.  5. Return to clinic in one week for suture/staple removal.   On disability.    Felecia Shelling, DPM [facility]  Dr. Felecia Shelling, DPM    [address]                                        [place], [place] [address]                Office [phone]  Fax [phone]

## 2019-11-09 ENCOUNTER — Other Ambulatory Visit: Payer: Self-pay

## 2019-11-09 ENCOUNTER — Ambulatory Visit (INDEPENDENT_AMBULATORY_CARE_PROVIDER_SITE_OTHER): Payer: Medicare HMO | Admitting: Podiatry

## 2019-11-09 ENCOUNTER — Encounter: Payer: Self-pay | Admitting: Podiatry

## 2019-11-09 DIAGNOSIS — M2010 Hallux valgus (acquired), unspecified foot: Secondary | ICD-10-CM

## 2019-11-09 DIAGNOSIS — M898X7 Other specified disorders of bone, ankle and foot: Secondary | ICD-10-CM

## 2019-11-09 DIAGNOSIS — Z9889 Other specified postprocedural states: Secondary | ICD-10-CM

## 2019-11-09 MED ORDER — OXYCODONE-ACETAMINOPHEN 5-325 MG PO TABS: 1.0000 | ORAL_TABLET | Freq: Four times a day (QID) | ORAL | 0 refills | Status: DC | PRN

## 2019-11-09 MED ORDER — DOXYCYCLINE HYCLATE 100 MG PO TABS: 100.0000 mg | ORAL_TABLET | Freq: Two times a day (BID) | ORAL | 0 refills | Status: DC

## 2019-11-10 NOTE — Progress Notes (Signed)
   Subjective:  Patient presents today status post bunionectomy right; exostectomy right 2nd toe. DOS: 10/25/2019. He reports continued pain and states he is out of pain medication. He is requesting a refill. He states he has been using the CAM boot as directed. Patient is here for further evaluation and treatment.   Past Medical History:  Diagnosis Date  . Bipolar 1 disorder (HCC)   . Hypertension   . Pancreatitis   . PTSD (post-traumatic stress disorder)       Objective/Physical Exam Neurovascular status intact.  Skin incisions appear to be well coapted with sutures and staples intact. No sign of infectious process noted. No dehiscence. No active bleeding noted. Moderate edema noted to the surgical extremity.  Radiographic Exam:  Orthopedic hardware and osteotomies sites appear to be stable with routine healing. Dark discoloration noted around the incision site with pain on palpation noted.   Assessment: 1. s/p bunionectomy right; exostectomy right 2nd toe. DOS: 10/25/2019   Plan of Care:  1. Patient was evaluated. X-rays reviewed 2. Partial staples removed. Dry sterile dressing applied.  3. Prescription for Doxycycline 100 mg #20 provided to patient for prophylaxis.  4. Refill prescription for Percocet 5/325 mg provided to patient.  5. Return to clinic in one week.   On disability.    Felecia Shelling, DPM [facility]  Dr. Felecia Shelling, DPM    [address]                                        [place], [place] [address]                Office [phone]  Fax [phone]

## 2019-11-16 ENCOUNTER — Other Ambulatory Visit: Payer: Self-pay | Admitting: Podiatry

## 2019-11-16 MED ORDER — OXYCODONE-ACETAMINOPHEN 5-325 MG PO TABS: 1.0000 | ORAL_TABLET | Freq: Four times a day (QID) | ORAL | 0 refills | Status: DC | PRN

## 2019-11-16 NOTE — Progress Notes (Signed)
PRN postop 

## 2019-11-16 NOTE — Progress Notes (Signed)
Patient informed that script has been sent to pharmacy

## 2019-11-23 ENCOUNTER — Ambulatory Visit (INDEPENDENT_AMBULATORY_CARE_PROVIDER_SITE_OTHER): Payer: Medicare HMO

## 2019-11-23 ENCOUNTER — Encounter: Payer: Self-pay | Admitting: Podiatry

## 2019-11-23 ENCOUNTER — Other Ambulatory Visit: Payer: Self-pay

## 2019-11-23 ENCOUNTER — Ambulatory Visit (INDEPENDENT_AMBULATORY_CARE_PROVIDER_SITE_OTHER): Payer: Medicare HMO | Admitting: Podiatry

## 2019-11-23 DIAGNOSIS — M898X7 Other specified disorders of bone, ankle and foot: Secondary | ICD-10-CM

## 2019-11-23 DIAGNOSIS — M2011 Hallux valgus (acquired), right foot: Secondary | ICD-10-CM

## 2019-11-23 DIAGNOSIS — M2010 Hallux valgus (acquired), unspecified foot: Secondary | ICD-10-CM | POA: Diagnosis not present

## 2019-11-23 DIAGNOSIS — Z9889 Other specified postprocedural states: Secondary | ICD-10-CM

## 2019-11-23 MED ORDER — OXYCODONE-ACETAMINOPHEN 5-325 MG PO TABS: 1.0000 | ORAL_TABLET | Freq: Three times a day (TID) | ORAL | 0 refills | Status: DC | PRN

## 2019-11-23 NOTE — Progress Notes (Signed)
   Subjective:  Patient presents today status post bunionectomy right; exostectomy right 2nd toe. DOS: 10/25/2019.  Patient states that he is doing much better.  He does notice some stiffness in the toe.  No new complaints at this time  Past Medical History:  Diagnosis Date  . Bipolar 1 disorder (HCC)   . Hypertension   . Pancreatitis   . PTSD (post-traumatic stress disorder)       Objective/Physical Exam Neurovascular status intact.  Skin incisions appear to be well coapted with with routine healing noted. No sign of infectious process noted. No dehiscence. No active bleeding noted.  No edema noted.  There is some stiffness to the range of motion of the first MTPJ  Assessment: 1. s/p bunionectomy right; exostectomy right 2nd toe. DOS: 10/25/2019   Plan of Care:  1. Patient was evaluated.  2.  Remaining staples and suture was removed today. 3.  Discontinue cam boot.  Postoperative shoe dispensed.  Weightbearing as tolerated. 4.  Begin range of motion exercises at home daily 5.  Ace wrap dispensed.  Apply daily 6.  Refill prescription for Percocet 5/3 and 25 mg 7.  Return to clinic in 4 weeks  On disability.    Felecia Shelling, DPM [facility]  Dr. Felecia Shelling, DPM    [address]                                        [place], [place] [address]                Office [phone]  Fax [phone]

## 2019-12-11 ENCOUNTER — Ambulatory Visit (INDEPENDENT_AMBULATORY_CARE_PROVIDER_SITE_OTHER): Payer: Medicare HMO | Admitting: Podiatry

## 2019-12-11 ENCOUNTER — Telehealth: Payer: Self-pay | Admitting: Podiatry

## 2019-12-11 ENCOUNTER — Other Ambulatory Visit: Payer: Self-pay

## 2019-12-11 ENCOUNTER — Encounter: Payer: Self-pay | Admitting: Podiatry

## 2019-12-11 DIAGNOSIS — M2010 Hallux valgus (acquired), unspecified foot: Secondary | ICD-10-CM

## 2019-12-11 DIAGNOSIS — Z9889 Other specified postprocedural states: Secondary | ICD-10-CM

## 2019-12-11 DIAGNOSIS — M898X7 Other specified disorders of bone, ankle and foot: Secondary | ICD-10-CM

## 2019-12-11 MED ORDER — DOXYCYCLINE HYCLATE 100 MG PO TABS: 100.0000 mg | ORAL_TABLET | Freq: Two times a day (BID) | ORAL | 0 refills | Status: DC

## 2019-12-11 MED ORDER — OXYCODONE-ACETAMINOPHEN 5-325 MG PO TABS: 1.0000 | ORAL_TABLET | Freq: Three times a day (TID) | ORAL | 0 refills | Status: DC | PRN

## 2019-12-11 NOTE — Telephone Encounter (Signed)
I spoke with pt he states he has an appt in [place] at 2:15pm and I told him that he should ask for the pain medication at that time also.

## 2019-12-11 NOTE — Telephone Encounter (Signed)
The patient called in c/o foot being painful and swollen and dark in color continuing up the leg. Please advise. Requesting pain medication.

## 2019-12-11 NOTE — Progress Notes (Signed)
   Subjective:  Patient presents today status post bunionectomy right; exostectomy right 2nd toe. DOS: 10/25/2019.  Patient states that since last visit he has noticed an increase in pain and swelling with numbness to the right surgical foot.  He states that he has not been walking on it very much and he has been using the postsurgical shoe as directed.  Due to the increase in pain he presents earlier than his next scheduled appointment for evaluation  Past Medical History:  Diagnosis Date  . Bipolar 1 disorder (HCC)   . Hypertension   . Pancreatitis   . PTSD (post-traumatic stress disorder)       Objective/Physical Exam Neurovascular status intact.  Skin incisions appear to be completely healed.  Skin is warm to touch with increased edema noted diffusely throughout the right foot.  There is paresthesia also noted with light touch of the forefoot.  Edema noted localized to the right foot  Assessment: 1. s/p bunionectomy right; exostectomy right 2nd toe. DOS: 10/25/2019   Plan of Care:  1. Patient was evaluated.  2. Refill prescription for Percocet 5/3 and 25 mg 3.  Prescription for doxycycline 100 mg #22 times daily.  Due to the increased warmth and edema I will send in the antibiotics in case there is cellulitis of the foot 4.  Continue minimal weightbearing in the postsurgical shoe 5.  Compression ankle sleeve dispensed. 6.  Return to clinic on neck scheduled appointment in 2 weeks for follow-up x-ray  On disability.    Felecia Shelling, DPM [facility]  Dr. Felecia Shelling, DPM    [address]                                        [place], [place] [address]                Office [phone]  Fax [phone]

## 2019-12-18 ENCOUNTER — Ambulatory Visit: Payer: Medicare HMO | Admitting: Podiatry

## 2019-12-19 ENCOUNTER — Ambulatory Visit: Payer: Medicare HMO | Attending: Oncology

## 2019-12-19 ENCOUNTER — Inpatient Hospital Stay: Payer: Medicare HMO | Admitting: Oncology

## 2019-12-19 ENCOUNTER — Encounter: Payer: Self-pay | Admitting: Oncology

## 2019-12-21 ENCOUNTER — Other Ambulatory Visit: Payer: Self-pay

## 2019-12-21 ENCOUNTER — Ambulatory Visit (INDEPENDENT_AMBULATORY_CARE_PROVIDER_SITE_OTHER): Payer: Medicare HMO | Admitting: Podiatry

## 2019-12-21 ENCOUNTER — Ambulatory Visit (INDEPENDENT_AMBULATORY_CARE_PROVIDER_SITE_OTHER): Payer: Medicare HMO

## 2019-12-21 ENCOUNTER — Encounter: Payer: Self-pay | Admitting: Podiatry

## 2019-12-21 DIAGNOSIS — M898X7 Other specified disorders of bone, ankle and foot: Secondary | ICD-10-CM

## 2019-12-21 DIAGNOSIS — Z9889 Other specified postprocedural states: Secondary | ICD-10-CM

## 2019-12-21 DIAGNOSIS — M2010 Hallux valgus (acquired), unspecified foot: Secondary | ICD-10-CM

## 2019-12-21 DIAGNOSIS — M2011 Hallux valgus (acquired), right foot: Secondary | ICD-10-CM

## 2019-12-21 MED ORDER — OXYCODONE-ACETAMINOPHEN 5-325 MG PO TABS: 1.0000 | ORAL_TABLET | Freq: Three times a day (TID) | ORAL | 0 refills | Status: DC | PRN

## 2019-12-21 NOTE — Progress Notes (Signed)
   Subjective:  Patient presents today status post bunionectomy right; exostectomy right 2nd toe. DOS: 10/25/2019.  Patient states that he continues to have pain.  He did try wearing tennis shoes once but the pain was increased and he was unable to tolerate it.  No new complaints at this time  Past Medical History:  Diagnosis Date  . Bipolar 1 disorder (HCC)   . Hypertension   . Pancreatitis   . PTSD (post-traumatic stress disorder)       Objective/Physical Exam Neurovascular status intact.  Skin incisions appear to be completely healed.  There is some hyperkeratotic skin and a thickened dystrophic elongated nail plate to the right second toe.  There is paresthesia also noted with light touch of the forefoot.  There continues to be some residual edema noted localized to the right foot  Radiographic exam Osteotomies appear to be stable with routine healing.  Orthopedic screws intact.  No evidence of fracture or osteolytic periosteal reaction.  Joint spaces preserved.  Assessment: 1. s/p bunionectomy right; exostectomy right 2nd toe. DOS: 10/25/2019   Plan of Care:  1. Patient was evaluated.  2. Refill prescription for Percocet 5/3 and 25 mg 3.  Light debridement of the dead skin and nail plate of the right second toe was performed.  After debridement the patient states that it feels much better. 4.  Patient may discontinue postoperative shoe.  Recommend good supportive sneakers 5.  Return to clinic in 1 month  On disability.    Felecia Shelling, DPM [facility]  Dr. Felecia Shelling, DPM    [address]                                        [place], [place] [address]                Office [phone]  Fax [phone]

## 2020-01-01 ENCOUNTER — Telehealth: Payer: Self-pay | Admitting: *Deleted

## 2020-01-01 ENCOUNTER — Encounter: Payer: Self-pay | Admitting: *Deleted

## 2020-01-01 NOTE — Telephone Encounter (Signed)
After multiple attempts, no show appt, and voicemails, we have been unable to contact this patient to schedule and coordinate lung screening scan. We will send a letter in final attempt to contact patient.

## 2020-01-02 ENCOUNTER — Inpatient Hospital Stay: Payer: Medicare HMO | Admitting: Oncology

## 2020-01-22 ENCOUNTER — Ambulatory Visit (INDEPENDENT_AMBULATORY_CARE_PROVIDER_SITE_OTHER): Payer: Medicare HMO | Admitting: Podiatry

## 2020-01-22 ENCOUNTER — Other Ambulatory Visit: Payer: Self-pay

## 2020-01-22 DIAGNOSIS — M898X7 Other specified disorders of bone, ankle and foot: Secondary | ICD-10-CM

## 2020-01-22 DIAGNOSIS — M2010 Hallux valgus (acquired), unspecified foot: Secondary | ICD-10-CM

## 2020-01-22 DIAGNOSIS — Z9889 Other specified postprocedural states: Secondary | ICD-10-CM

## 2020-01-22 DIAGNOSIS — M7751 Other enthesopathy of right foot: Secondary | ICD-10-CM

## 2020-01-22 MED ORDER — OXYCODONE-ACETAMINOPHEN 5-325 MG PO TABS: 1.0000 | ORAL_TABLET | Freq: Three times a day (TID) | ORAL | 0 refills | Status: DC | PRN

## 2020-01-22 NOTE — Progress Notes (Signed)
   Subjective:  Patient presents today status post bunionectomy right; exostectomy right 2nd toe. DOS: 10/25/2019.  Patient states that he continues to have pain.  Patient states that he is having a better time wearing tennis shoes however he continues to have pain especially during ambulation.  No new complaints at this time  Past Medical History:  Diagnosis Date  . Bipolar 1 disorder (HCC)   . Hypertension   . Pancreatitis   . PTSD (post-traumatic stress disorder)      Objective: Physical Exam General: The patient is alert and oriented x3 in no acute distress.  Dermatology: Skin is cool, dry and supple bilateral lower extremities. Negative for open lesions or macerations.  Skin incisions are completely healed.  There is some hyperkeratotic hard callus tissue noted to the distal tip of the right second toe where the surgical exostectomy was performed.  Vascular: Palpable pedal pulses bilaterally. No edema or erythema noted. Capillary refill within normal limits.  Neurological: Epicritic and protective threshold grossly intact bilaterally.   Musculoskeletal Exam: All pedal and ankle joints range of motion within normal limits bilateral. Muscle strength 5/5 in all groups bilateral.  There is pain on palpation and range of motion to the first MTPJ of the right foot.  There is also crepitus noted.  Assessment: 1. s/p bunionectomy right; exostectomy right 2nd toe. DOS: 10/25/2019 2.  Osteoarthritis/DJD first MTPJ right foot   Plan of Care:  1. Patient was evaluated.  2.  Light debridement was performed of the hyperkeratotic callus tissue noted to the distal tip of the right second toe without incident or bleeding using a chisel blade 3.  Injection of 0.5 cc Celestone Soluspan injected into the first MTPJ right foot 4.  Continue wearing good supportive sneakers 5.  Recommend daily stretching and range of motion exercises to the first MTPJ 6.  Refill prescription for Percocet 5/325 mg #21  every 8 hours as needed pain  7.  Return to clinic in 4 weeks.  If the patient is not better we may need to discuss possible first MTPJ arthroplasty with implant  On disability.    Felecia Shelling, DPM [facility]  Dr. Felecia Shelling, DPM    [address]                                        [place], [place] [address]                Office [phone]  Fax [phone]

## 2020-02-22 ENCOUNTER — Encounter: Payer: Medicare HMO | Admitting: Podiatry

## 2020-02-26 ENCOUNTER — Other Ambulatory Visit: Payer: Self-pay

## 2020-02-26 ENCOUNTER — Ambulatory Visit (INDEPENDENT_AMBULATORY_CARE_PROVIDER_SITE_OTHER): Payer: Medicare HMO | Admitting: Podiatry

## 2020-02-26 ENCOUNTER — Ambulatory Visit (INDEPENDENT_AMBULATORY_CARE_PROVIDER_SITE_OTHER): Payer: Medicare HMO

## 2020-02-26 DIAGNOSIS — M19071 Primary osteoarthritis, right ankle and foot: Secondary | ICD-10-CM

## 2020-02-26 DIAGNOSIS — M7751 Other enthesopathy of right foot: Secondary | ICD-10-CM

## 2020-02-26 DIAGNOSIS — M205X1 Other deformities of toe(s) (acquired), right foot: Secondary | ICD-10-CM

## 2020-02-26 MED ORDER — OXYCODONE-ACETAMINOPHEN 5-325 MG PO TABS: 1.0000 | ORAL_TABLET | Freq: Three times a day (TID) | ORAL | 0 refills | Status: DC | PRN

## 2020-02-26 NOTE — Progress Notes (Signed)
   Subjective:  Patient presents today status post bunionectomy right; exostectomy right 2nd toe. DOS: 10/25/2019.  Last visit on 01/22/2020 he did receive anti-inflammatory steroidal injection into the first MTPJ.  He states that he did get some temporary relief however the pain has recurred and it is very painful with range of motion and palpation to the joint.  The patient is unable to walk or ambulate without pain.  No new complaints at this time  Past Medical History:  Diagnosis Date  . Bipolar 1 disorder (HCC)   . Hypertension   . Pancreatitis   . PTSD (post-traumatic stress disorder)      Objective: Physical Exam General: The patient is alert and oriented x3 in no acute distress.  Dermatology: Skin is cool, dry and supple bilateral lower extremities. Negative for open lesions or macerations.  Skin incisions are completely healed.   Vascular: Palpable pedal pulses bilaterally. No edema or erythema noted. Capillary refill within normal limits.  Neurological: Epicritic and protective threshold grossly intact bilaterally.   Musculoskeletal Exam: All pedal and ankle joints range of motion within normal limits bilateral. Muscle strength 5/5 in all groups bilateral.  There is pain on palpation and range of motion to the first MTPJ of the right foot.  There is also crepitus noted.  Radiographic exam: Joint space narrowing with degenerative changes noted the first MTPJ.  The ray is in good alignment.  Orthopedic screws are intact.  Assessment: 1. s/p bunionectomy right; exostectomy right 2nd toe. DOS: 10/25/2019 2.  Osteoarthritis/DJD first MTPJ right foot 3.  Hallux limitus right 4.  First MTPJ capsulitis right   Plan of Care:  1. Patient was evaluated.  2. Today we discussed the conservative versus surgical management of the presenting pathology. The patient opts for surgical management. All possible complications and details of the procedure were explained. All patient questions were  answered. No guarantees were expressed or implied. 3. Authorization for surgery was initiated today. Surgery will consist of great toe arthroplasty with implant right.  Removal of orthopedic screws x2 right. 4.  Refill prescription for Percocet 5/325 mg  5.  Return to clinic 1 week postop   On disability.    Felecia Shelling, DPM [facility]  Dr. Felecia Shelling, DPM    [address]                                        [place], [place] [address]                Office [phone]  Fax [phone]

## 2020-03-10 ENCOUNTER — Telehealth: Payer: Self-pay

## 2020-03-10 NOTE — Telephone Encounter (Signed)
DOS 03/20/2020  KELLER BUNION IMPLANT RT - 74944 REMOVAL FIXATION DEEP X 2 RT - 20680  [employer] MEDICARE EFFECTIVE DATE - 06/15/2019  PLAN DEDUCTIBLE - $0.00 OUT OF POCKET - $203.00 W/ $203.00 REMAINING COPAY $0.00 COINSURANCE - 100%  SPOKE TO [facility] AT [facility] REQUIRED FOR CPT [phone] [place] [address]. CALL REF# [id number]

## 2020-03-18 ENCOUNTER — Telehealth (INDEPENDENT_AMBULATORY_CARE_PROVIDER_SITE_OTHER): Payer: Medicare HMO | Admitting: Podiatry

## 2020-03-18 NOTE — Telephone Encounter (Signed)
Walter [name] called stating he is scheduled to have surgery this Thursday and hasn't heard anything yet. Not sure of time he is to report for surgery. Please advise. Patient call back number is [phone].

## 2020-03-20 ENCOUNTER — Other Ambulatory Visit: Payer: Self-pay | Admitting: Podiatry

## 2020-03-20 DIAGNOSIS — Z4889 Encounter for other specified surgical aftercare: Secondary | ICD-10-CM | POA: Diagnosis not present

## 2020-03-20 DIAGNOSIS — M205X1 Other deformities of toe(s) (acquired), right foot: Secondary | ICD-10-CM | POA: Diagnosis not present

## 2020-03-20 MED ORDER — DOXYCYCLINE HYCLATE 100 MG PO TABS: 100.0000 mg | ORAL_TABLET | Freq: Two times a day (BID) | ORAL | 0 refills | Status: AC

## 2020-03-20 MED ORDER — MELOXICAM 15 MG PO TABS: 15.0000 mg | ORAL_TABLET | Freq: Every day | ORAL | 1 refills | Status: DC

## 2020-03-20 MED ORDER — OXYCODONE-ACETAMINOPHEN 5-325 MG PO TABS: 1.0000 | ORAL_TABLET | ORAL | 0 refills | Status: DC | PRN

## 2020-03-20 NOTE — Progress Notes (Signed)
PRN postop 

## 2020-03-21 ENCOUNTER — Telehealth: Payer: Self-pay | Admitting: *Deleted

## 2020-03-21 NOTE — Telephone Encounter (Signed)
Patient had surgery with Dr. Logan Bores yesterday on his toe and has a few questions.

## 2020-03-28 ENCOUNTER — Ambulatory Visit (INDEPENDENT_AMBULATORY_CARE_PROVIDER_SITE_OTHER): Payer: Medicare HMO

## 2020-03-28 ENCOUNTER — Ambulatory Visit (INDEPENDENT_AMBULATORY_CARE_PROVIDER_SITE_OTHER): Payer: Medicare HMO | Admitting: Podiatry

## 2020-03-28 ENCOUNTER — Encounter: Payer: Self-pay | Admitting: Podiatry

## 2020-03-28 ENCOUNTER — Other Ambulatory Visit: Payer: Self-pay

## 2020-03-28 ENCOUNTER — Encounter (INDEPENDENT_AMBULATORY_CARE_PROVIDER_SITE_OTHER): Payer: Self-pay

## 2020-03-28 ENCOUNTER — Telehealth: Payer: Self-pay | Admitting: Podiatry

## 2020-03-28 VITALS — BP 158/93 | HR 89 | Temp 96.1°F

## 2020-03-28 DIAGNOSIS — Z9889 Other specified postprocedural states: Secondary | ICD-10-CM | POA: Diagnosis not present

## 2020-03-28 DIAGNOSIS — M205X1 Other deformities of toe(s) (acquired), right foot: Secondary | ICD-10-CM | POA: Diagnosis not present

## 2020-03-28 MED ORDER — OXYCODONE-ACETAMINOPHEN 5-325 MG PO TABS: 1.0000 | ORAL_TABLET | Freq: Four times a day (QID) | ORAL | 0 refills | Status: DC | PRN

## 2020-03-28 MED ORDER — OXYCODONE-ACETAMINOPHEN 5-325 MG PO TABS
1.0000 | ORAL_TABLET | ORAL | 0 refills | Status: DC | PRN
Start: 2020-03-28 — End: 2020-03-28

## 2020-03-28 NOTE — Progress Notes (Signed)
   Subjective:  Patient presents today status post removal of orthopedic screws and great toe arthroplasty with implant right foot. DOS: 03/20/2020.  Patient states that he is feeling well.  The pain is controlled with Percocet 5/325 mg.  He has been weightbearing in the cam boot as instructed.  No new complaints at this time.  Past Medical History:  Diagnosis Date  . Bipolar 1 disorder (HCC)   . Hypertension   . Pancreatitis   . PTSD (post-traumatic stress disorder)       Objective/Physical Exam Neurovascular status intact.  Skin incisions appear to be well coapted with staples intact. No sign of infectious process noted. No dehiscence. No active bleeding noted. Moderate edema noted to the surgical extremity.  Radiographic Exam:  Orthopedic great toe implant and osteotomies sites appear to be stable with routine healing.  Assessment: 1. s/p great toe arthroplasty with implant right. DOS: 03/20/2020   Plan of Care:  1. Patient was evaluated. X-rays reviewed 2.  Dressings changed today.  Keep clean dry and intact x1 week 3.  Continue weightbearing in the cam boot as instructed 4.  Refill prescription for Percocet 5/325 mg 5.  Return to clinic in 1 week for staple removal   Felecia Shelling, DPM [facility]  Dr. Felecia Shelling, DPM    [address]                                        [place], [place] [address]                Office [phone]  Fax [phone]

## 2020-03-28 NOTE — Addendum Note (Signed)
Addended by: Geraldine Contras D on: 03/28/2020 02:35 PM   Modules accepted: Orders

## 2020-03-28 NOTE — Telephone Encounter (Signed)
Pt's emergency contact called to say Walter [name] [phone] has not received Rx for oxycodone 5-325 tablet.

## 2020-03-28 NOTE — Addendum Note (Signed)
Addended by: Felecia Shelling on: 03/28/2020 01:52 PM   Modules accepted: Orders

## 2020-04-04 ENCOUNTER — Other Ambulatory Visit: Payer: Self-pay

## 2020-04-04 ENCOUNTER — Ambulatory Visit (INDEPENDENT_AMBULATORY_CARE_PROVIDER_SITE_OTHER): Payer: Medicare HMO | Admitting: Podiatry

## 2020-04-04 DIAGNOSIS — Z9889 Other specified postprocedural states: Secondary | ICD-10-CM

## 2020-04-04 DIAGNOSIS — M205X1 Other deformities of toe(s) (acquired), right foot: Secondary | ICD-10-CM | POA: Diagnosis not present

## 2020-04-04 MED ORDER — OXYCODONE-ACETAMINOPHEN 5-325 MG PO TABS
1.0000 | ORAL_TABLET | Freq: Four times a day (QID) | ORAL | 0 refills | Status: DC | PRN
Start: 2020-04-04 — End: 2020-04-15

## 2020-04-04 NOTE — Progress Notes (Signed)
   Subjective:  Patient presents today status post removal of orthopedic screws and great toe arthroplasty with implant right foot. DOS: 03/20/2020.  Patient continues to have pain in his foot.  He has been weightbearing in the cam boot as instructed.  No new complaints at this time  Past Medical History:  Diagnosis Date  . Bipolar 1 disorder (HCC)   . Hypertension   . Pancreatitis   . PTSD (post-traumatic stress disorder)       Objective/Physical Exam Neurovascular status intact.  Skin incisions appear to be well coapted with staples intact. No sign of infectious process noted. No dehiscence. No active bleeding noted. Moderate edema noted to the surgical extremity.  Assessment: 1. s/p great toe arthroplasty with implant right. DOS: 03/20/2020   Plan of Care:  1. Patient was evaluated.  2.  Staples removed today. 3.  Discontinue cam boot.  Postsurgical shoe dispensed today. 4.  Refill prescription for Percocet 5/325 mg 5.  Return to clinic in 4 weeks   Felecia Shelling, DPM [facility]  Dr. Felecia Shelling, DPM    [address]                                        [place], [place] [address]                Office [phone]  Fax [phone]

## 2020-04-14 ENCOUNTER — Telehealth: Payer: Self-pay | Admitting: Podiatry

## 2020-04-14 NOTE — Telephone Encounter (Signed)
Patient called in requesting refill, please advise

## 2020-04-15 ENCOUNTER — Other Ambulatory Visit: Payer: Self-pay | Admitting: Podiatry

## 2020-04-15 MED ORDER — OXYCODONE-ACETAMINOPHEN 5-325 MG PO TABS
1.0000 | ORAL_TABLET | Freq: Four times a day (QID) | ORAL | 0 refills | Status: DC | PRN
Start: 2020-04-15 — End: 2020-04-15

## 2020-04-15 MED ORDER — OXYCODONE-ACETAMINOPHEN 5-325 MG PO TABS
1.0000 | ORAL_TABLET | Freq: Four times a day (QID) | ORAL | 0 refills | Status: DC | PRN
Start: 2020-04-15 — End: 2020-05-02

## 2020-04-15 NOTE — Progress Notes (Signed)
PRN postop 

## 2020-04-15 NOTE — Progress Notes (Signed)
PRN postop pain 

## 2020-04-18 ENCOUNTER — Encounter: Payer: Medicare HMO | Admitting: Podiatry

## 2020-05-02 ENCOUNTER — Encounter: Payer: Self-pay | Admitting: Podiatry

## 2020-05-02 ENCOUNTER — Ambulatory Visit (INDEPENDENT_AMBULATORY_CARE_PROVIDER_SITE_OTHER): Payer: Medicare HMO

## 2020-05-02 ENCOUNTER — Ambulatory Visit (INDEPENDENT_AMBULATORY_CARE_PROVIDER_SITE_OTHER): Payer: Medicare HMO | Admitting: Podiatry

## 2020-05-02 ENCOUNTER — Other Ambulatory Visit: Payer: Self-pay

## 2020-05-02 DIAGNOSIS — M205X1 Other deformities of toe(s) (acquired), right foot: Secondary | ICD-10-CM | POA: Diagnosis not present

## 2020-05-02 DIAGNOSIS — Z9889 Other specified postprocedural states: Secondary | ICD-10-CM | POA: Diagnosis not present

## 2020-05-02 MED ORDER — OXYCODONE-ACETAMINOPHEN 5-325 MG PO TABS: 1.0000 | ORAL_TABLET | Freq: Four times a day (QID) | ORAL | 0 refills | Status: DC | PRN

## 2020-05-02 NOTE — Progress Notes (Signed)
   Subjective:  Patient presents today status post removal of orthopedic screws and great toe arthroplasty with implant right foot. DOS: 03/20/2020.  Patient states that his foot is improving significantly.  He has been weightbearing in the postsurgical shoe as instructed.  No new complaints at this time  Past Medical History:  Diagnosis Date  . Bipolar 1 disorder (HCC)   . Hypertension   . Pancreatitis   . PTSD (post-traumatic stress disorder)       Objective/Physical Exam Neurovascular status intact.  Skin incisions appear to be well coapted and healed. No sign of infectious process noted. No dehiscence. No active bleeding noted.  Negative for any significant range of motion to the first MTPJ.  Patient seems to be doing very well.  There is some limited range of motion overall.  Assessment: 1. s/p great toe arthroplasty with implant right. DOS: 03/20/2020   Plan of Care:  1. Patient was evaluated.  2.  Recommend daily range of motion exercises to the first MTPJ right foot 3.  Discontinue postsurgical shoe.  Patient may wear good supportive sneakers 4.  Refill prescription for Percocet 5/325 mg 5.  Return to clinic in 6 weeks.  At this time we will assess the left foot and surgical consultation.  Patient states that he has continued chronic pain to the left foot.   Felecia Shelling, DPM [facility]  Dr. Felecia Shelling, DPM    [address]                                        [place], [place] [address]                Office [phone]  Fax [phone]

## 2020-06-17 ENCOUNTER — Ambulatory Visit (INDEPENDENT_AMBULATORY_CARE_PROVIDER_SITE_OTHER): Payer: Medicare Other | Admitting: Podiatry

## 2020-06-17 ENCOUNTER — Ambulatory Visit (INDEPENDENT_AMBULATORY_CARE_PROVIDER_SITE_OTHER): Payer: Medicare Other

## 2020-06-17 ENCOUNTER — Other Ambulatory Visit: Payer: Self-pay

## 2020-06-17 DIAGNOSIS — Z9889 Other specified postprocedural states: Secondary | ICD-10-CM

## 2020-06-17 DIAGNOSIS — M2011 Hallux valgus (acquired), right foot: Secondary | ICD-10-CM | POA: Diagnosis not present

## 2020-06-17 DIAGNOSIS — R52 Pain, unspecified: Secondary | ICD-10-CM | POA: Diagnosis not present

## 2020-06-17 DIAGNOSIS — M2010 Hallux valgus (acquired), unspecified foot: Secondary | ICD-10-CM

## 2020-06-17 DIAGNOSIS — M2012 Hallux valgus (acquired), left foot: Secondary | ICD-10-CM | POA: Diagnosis not present

## 2020-06-17 DIAGNOSIS — M205X2 Other deformities of toe(s) (acquired), left foot: Secondary | ICD-10-CM | POA: Diagnosis not present

## 2020-06-17 NOTE — Patient Instructions (Signed)
Pre-Operative Instructions  Congratulations, you have decided to take an important step towards improving your quality of life.  You can be assured that the doctors and staff at [facility] will be with you every step of the way.  Here are some important things you should know:  1. Plan to be at the surgery center/hospital at least 1 (one) hour prior to your scheduled time, unless otherwise directed by the surgical center/hospital staff.  You must have a responsible adult accompany you, remain during the surgery and drive you home.  Make sure you have directions to the surgical center/hospital to ensure you arrive on time. 2. If you are having surgery at [facility] or [facility] hospitals, you will need a copy of your medical history and physical form from your family physician within one month prior to the date of surgery. We will give you a form for your primary physician to complete.  3. We make every effort to accommodate the date you request for surgery.  However, there are times where surgery dates or times have to be moved.  We will contact you as soon as possible if a change in schedule is required.   4. No aspirin/ibuprofen for one week before surgery.  If you are on aspirin, any non-steroidal anti-inflammatory medications (Mobic, Aleve, Ibuprofen) should not be taken seven (7) days prior to your surgery.  You make take Tylenol for pain prior to surgery.  5. Medications - If you are taking daily heart and blood pressure medications, seizure, reflux, allergy, asthma, anxiety, pain or diabetes medications, make sure you notify the surgery center/hospital before the day of surgery so they can tell you which medications you should take or avoid the day of surgery. 6. No food or drink after midnight the night before surgery unless directed otherwise by surgical center/hospital staff. 7. No alcoholic beverages 24-hours prior to surgery.  No smoking 24-hours prior or 24-hours after  surgery. 8. Wear loose pants or shorts. They should be loose enough to fit over bandages, boots, and casts. 9. Don't wear slip-on shoes. Sneakers are preferred. 10. Bring your boot with you to the surgery center/hospital.  Also bring crutches or a walker if your physician has prescribed it for you.  If you do not have this equipment, it will be provided for you after surgery. 11. If you have not been contacted by the surgery center/hospital by the day before your surgery, call to confirm the date and time of your surgery. 12. Leave-time from work may vary depending on the type of surgery you have.  Appropriate arrangements should be made prior to surgery with your employer. 13. Prescriptions will be provided immediately following surgery by your doctor.  Fill these as soon as possible after surgery and take the medication as directed. Pain medications will not be refilled on weekends and must be approved by the doctor. 14. Remove nail polish on the operative foot and avoid getting pedicures prior to surgery. 15. Wash the night before surgery.  The night before surgery wash the foot and leg well with water and the antibacterial soap provided. Be sure to pay special attention to beneath the toenails and in between the toes.  Wash for at least three (3) minutes. Rinse thoroughly with water and dry well with a towel.  Perform this wash unless told not to do so by your physician.  Enclosed: 1 Ice pack (please put in freezer the night before surgery)   1 Hibiclens skin cleaner     Pre-op instructions  If you have any questions regarding the instructions, please do not hesitate to call our office.  [place]: [address], [place], [place] [address] -- [phone]  [place]: [address] [place], [place] [address] -- [phone]  [place]: [address], [place], [place] [address] -- [phone]   Website: [email or web address] 

## 2020-06-17 NOTE — Progress Notes (Signed)
   Subjective:  Patient presents today status post removal of orthopedic screws and great toe arthroplasty with implant right foot. DOS: 03/20/2020.  Patient states that his foot is improving significantly.  He has been weightbearing in the postsurgical shoe as instructed.  No new complaints at this time  Past Medical History:  Diagnosis Date  . Bipolar 1 disorder (HCC)   . Hypertension   . Pancreatitis   . PTSD (post-traumatic stress disorder)       Objective/Physical Exam Neurovascular status intact.  Skin incisions appear to be well coapted and healed. No sign of infectious process noted. No dehiscence. No active bleeding noted.  There is some limited range of motion to the first MTPJ of the right foot.  Patient seems to be doing very well.  There is also some moderate edema noted to the surgical foot as well  Today there is pain on palpation and range of motion to the first MTPJ of the left foot.  Patient does have hallux valgus deformity.  Radiographic exam Well-healing surgical foot right.  Orthopedic implant shows good in alignment with routine healing.  Hallux valgus with increased intermetatarsal angle noted to the left foot with degenerative changes of the first MTPJ.  Associated tenderness to palpation and range of motion.  Assessment: 1. s/p great toe arthroplasty with implant right. DOS: 03/20/2020 2.  Hallux limitus with DJD/hallux valgus left   Plan of Care:  1. Patient was evaluated.  2.  Recommend daily range of motion exercises to the first MTPJ right foot 3.  Continue wearing good supportive sneakers  4. Today we discussed the conservative versus surgical management of the presenting pathology. The patient opts for surgical management. All possible complications and details of the procedure were explained. All patient questions were answered. No guarantees were expressed or implied. 3. Authorization for surgery was initiated today. Surgery will consist of left great  toe arthroplasty with implant.  The patient originally had bunionectomy surgery to the right foot with joint preservation however he developed pain and tenderness to the joint following surgery.  He would like to have the left foot undergo joint arthroplasty with implant versus joint salvage bunionectomy procedure. 4.  Return to clinic 1 week postop    Felecia Shelling, DPM [facility]  Dr. Felecia Shelling, DPM    [address] [place], [place] [address]                Office [phone]  Fax [phone]

## 2020-06-26 ENCOUNTER — Other Ambulatory Visit: Payer: Self-pay | Admitting: Podiatry

## 2020-06-26 DIAGNOSIS — M205X2 Other deformities of toe(s) (acquired), left foot: Secondary | ICD-10-CM

## 2020-08-05 ENCOUNTER — Telehealth: Payer: Self-pay

## 2020-08-05 NOTE — Telephone Encounter (Signed)
DOS 08/14/2020  KELLER BUNION IMPLANT LT - 28291  [employer] MEDICARE EFFECTIVE DATE - 06/14/2020  PLAN DEDUCTIBLE - $0.00 OUT OF POCKET - $7550.00 W/ $7550.00 REMAINING  CO-INSURANCE 20% / Day OUTPATIENT SURGERY 20% / Day OUTPATIENT HOSPITAL COPAY $0 / Day OUTPATIENT SURGERY $0 / Day OUTPATIENT HOSPITAL  Notification or Prior Authorization is not required for the requested services  This [employer] Medicare Advantage members plan does not currently require a prior authorization for these services. If you have general questions about the prior authorization requirements, please call us at [phone] or visit [email or web address] > Clinician Resources > Advance and Admission Notification Requirements. The number above acknowledges your notification. Please write this number down for future reference. Notification is not a guarantee of coverage or payment.  Decision ID #:[id number]

## 2020-08-14 ENCOUNTER — Encounter: Payer: Self-pay | Admitting: Podiatry

## 2020-08-14 ENCOUNTER — Other Ambulatory Visit: Payer: Self-pay | Admitting: Podiatry

## 2020-08-14 DIAGNOSIS — M205X2 Other deformities of toe(s) (acquired), left foot: Secondary | ICD-10-CM

## 2020-08-14 MED ORDER — OXYCODONE-ACETAMINOPHEN 5-325 MG PO TABS: 1.0000 | ORAL_TABLET | ORAL | 0 refills | Status: DC | PRN

## 2020-08-14 NOTE — Progress Notes (Signed)
PRN postop 

## 2020-08-22 ENCOUNTER — Other Ambulatory Visit: Payer: Self-pay

## 2020-08-22 ENCOUNTER — Ambulatory Visit (INDEPENDENT_AMBULATORY_CARE_PROVIDER_SITE_OTHER): Payer: Medicare Other | Admitting: Podiatry

## 2020-08-22 ENCOUNTER — Ambulatory Visit (INDEPENDENT_AMBULATORY_CARE_PROVIDER_SITE_OTHER): Payer: Medicare Other

## 2020-08-22 DIAGNOSIS — M205X2 Other deformities of toe(s) (acquired), left foot: Secondary | ICD-10-CM

## 2020-08-22 DIAGNOSIS — Z9889 Other specified postprocedural states: Secondary | ICD-10-CM

## 2020-08-22 MED ORDER — OXYCODONE-ACETAMINOPHEN 5-325 MG PO TABS: 1.0000 | ORAL_TABLET | ORAL | 0 refills | Status: DC | PRN

## 2020-08-22 NOTE — Progress Notes (Signed)
   Subjective:  Patient presents today status post first MTPJ arthroplasty with implant left. DOS: 08/14/2020.  Patient states that the pain is tolerable.  He has been weightbearing in the cam boot as instructed.  He is also kept the dressings clean dry and intact as instructed.  No new complaints at this time  Past Medical History:  Diagnosis Date  . Bipolar 1 disorder (HCC)   . Hypertension   . Pancreatitis   . PTSD (post-traumatic stress disorder)       Objective/Physical Exam Neurovascular status intact.  Skin incisions appear to be well coapted with staples intact. No sign of infectious process noted. No dehiscence. No active bleeding noted. Moderate edema noted to the surgical extremity.  Radiographic Exam:  Orthopedic hardware and osteotomies sites appear to be stable with routine healing.  Assessment: 1. s/p first MTPJ arthroplasty with implant left. DOS: 08/14/2020   Plan of Care:  1. Patient was evaluated. X-rays reviewed 2.  Dressings changed today.  Clean dry and intact x1 week 3.  Continue minimal weightbearing in the cam boot 4.  Refill prescription for Percocet 5/3 2 5  mg  5.  Return to clinic 1 week   Felecia Shelling, DPM [facility]  Dr. Felecia Shelling, DPM    [address] [place], [place] [address]                Office [phone]  Fax [phone]

## 2020-08-29 ENCOUNTER — Ambulatory Visit (INDEPENDENT_AMBULATORY_CARE_PROVIDER_SITE_OTHER): Payer: Medicare Other | Admitting: Podiatry

## 2020-08-29 ENCOUNTER — Other Ambulatory Visit: Payer: Self-pay

## 2020-08-29 ENCOUNTER — Encounter: Payer: Self-pay | Admitting: Podiatry

## 2020-08-29 DIAGNOSIS — M205X2 Other deformities of toe(s) (acquired), left foot: Secondary | ICD-10-CM | POA: Diagnosis not present

## 2020-08-29 DIAGNOSIS — Z9889 Other specified postprocedural states: Secondary | ICD-10-CM

## 2020-08-29 MED ORDER — OXYCODONE-ACETAMINOPHEN 5-325 MG PO TABS: 1.0000 | ORAL_TABLET | ORAL | 0 refills | Status: DC | PRN

## 2020-08-29 NOTE — Progress Notes (Signed)
   Subjective:  Patient presents today status post first MTPJ arthroplasty with implant left. DOS: 08/14/2020.  Patient doing well.  No new complaints at this time Past Medical History:  Diagnosis Date  . Bipolar 1 disorder (HCC)   . Hypertension   . Pancreatitis   . PTSD (post-traumatic stress disorder)       Objective/Physical Exam Neurovascular status intact.  Skin incisions appear to be well coapted with remaining staples intact. No sign of infectious process noted. No dehiscence. No active bleeding noted. Moderate edema noted to the surgical extremity.  Assessment: 1. s/p first MTPJ arthroplasty with implant left. DOS: 08/14/2020   Plan of Care:  1. Patient was evaluated.  2.  Remaining staples removed today 3.  Postsurgical shoe dispensed.  Discontinue cam boot 4.  Begin light range of motion exercises to the first MTPJ of the surgical foot 5.  Refill prescription for Percocet 5/325 mg  6.  Return to clinic in 4 weeks   Felecia Shelling, DPM [facility]  Dr. Felecia Shelling, DPM    [address] [place], [place] [address]                Office [phone]  Fax [phone]

## 2020-09-03 ENCOUNTER — Other Ambulatory Visit: Payer: Self-pay | Admitting: Podiatry

## 2020-09-03 ENCOUNTER — Telehealth: Payer: Self-pay | Admitting: *Deleted

## 2020-09-03 MED ORDER — OXYCODONE-ACETAMINOPHEN 5-325 MG PO TABS: 1.0000 | ORAL_TABLET | ORAL | 0 refills | Status: DC | PRN

## 2020-09-03 NOTE — Telephone Encounter (Signed)
"  The doctor said if I needed a refill of my pain medicine to call and let him know.  I had surgery."  I'll let Dr. Logan Bores and Angie know.  You'll receive a call back.

## 2020-09-03 NOTE — Telephone Encounter (Signed)
Rx sent. - Dr. Johndaniel Catlin

## 2020-09-03 NOTE — Telephone Encounter (Signed)
Patient notified of medication

## 2020-09-03 NOTE — Telephone Encounter (Signed)
Please advise 

## 2020-09-12 ENCOUNTER — Encounter: Payer: Self-pay | Admitting: Podiatry

## 2020-09-12 ENCOUNTER — Other Ambulatory Visit: Payer: Self-pay

## 2020-09-12 ENCOUNTER — Ambulatory Visit (INDEPENDENT_AMBULATORY_CARE_PROVIDER_SITE_OTHER): Payer: Medicare Other | Admitting: Podiatry

## 2020-09-12 ENCOUNTER — Ambulatory Visit (INDEPENDENT_AMBULATORY_CARE_PROVIDER_SITE_OTHER): Payer: Medicare Other

## 2020-09-12 DIAGNOSIS — Z9889 Other specified postprocedural states: Secondary | ICD-10-CM

## 2020-09-12 DIAGNOSIS — M205X2 Other deformities of toe(s) (acquired), left foot: Secondary | ICD-10-CM | POA: Diagnosis not present

## 2020-09-12 MED ORDER — OXYCODONE-ACETAMINOPHEN 5-325 MG PO TABS: 1.0000 | ORAL_TABLET | Freq: Four times a day (QID) | ORAL | 0 refills | Status: DC | PRN

## 2020-09-12 NOTE — Progress Notes (Signed)
   Subjective:  Patient presents today status post first MTPJ arthroplasty with implant left. DOS: 08/14/2020.  Patient doing well.  He continues to have pain to the foot.  no new complaints at this time Past Medical History:  Diagnosis Date  . Bipolar 1 disorder (HCC)   . Hypertension   . Pancreatitis   . PTSD (post-traumatic stress disorder)       Objective/Physical Exam Neurovascular status intact.  Skin incisions appear to be well coapted and healed. No sign of infectious process noted. No dehiscence. No active bleeding noted. Moderate edema noted to the surgical extremity.  Radiographic exam Silastic implant is intact to the first MTPJ.  Good alignment of the first ray.  There are 2 remaining stainless steel skin staples along the incision site at the level of the MTPJ.  Overall routine healing noted  Assessment: 1. s/p first MTPJ arthroplasty with implant left. DOS: 08/14/2020   Plan of Care:  1. Patient was evaluated.  The remaining 2 staples from the incision site which were buried were removed today.  Prior to removal of the skin staples, 1 mL of 2% lidocaine plain was infiltrated around the area for anesthesia 2.  Patient may discontinue postsurgical shoe.  Recommend good supportive sneakers and shoes 3.  Continue range of motion exercises to the first MTPJ. 4.  Refill prescription for Percocet 5/325 mg 5.  Return to clinic in 6 weeks  Felecia Shelling, DPM [facility]  Dr. Felecia Shelling, DPM    [address] [place], [place] [address]                Office [phone]  Fax [phone]

## 2020-09-18 ENCOUNTER — Telehealth: Payer: Self-pay

## 2020-09-18 NOTE — Telephone Encounter (Signed)
Pt called and stated that he is in a lot of pain and he would like to have pain meds sent in [facility].

## 2020-09-19 ENCOUNTER — Other Ambulatory Visit: Payer: Self-pay | Admitting: Podiatry

## 2020-09-19 MED ORDER — OXYCODONE-ACETAMINOPHEN 5-325 MG PO TABS: 1.0000 | ORAL_TABLET | Freq: Four times a day (QID) | ORAL | 0 refills | Status: DC | PRN

## 2020-09-19 NOTE — Progress Notes (Signed)
PRN postop 

## 2020-09-19 NOTE — Telephone Encounter (Signed)
Pt notified that medication has been sent in  

## 2020-10-03 ENCOUNTER — Encounter: Payer: Medicare Other | Admitting: Podiatry

## 2020-10-07 ENCOUNTER — Ambulatory Visit: Payer: Medicare Other | Admitting: Podiatry

## 2020-10-24 ENCOUNTER — Encounter: Payer: Self-pay | Admitting: Podiatry

## 2020-10-24 ENCOUNTER — Other Ambulatory Visit: Payer: Self-pay

## 2020-10-24 ENCOUNTER — Ambulatory Visit (INDEPENDENT_AMBULATORY_CARE_PROVIDER_SITE_OTHER): Payer: Medicare Other | Admitting: Podiatry

## 2020-10-24 ENCOUNTER — Ambulatory Visit (INDEPENDENT_AMBULATORY_CARE_PROVIDER_SITE_OTHER): Payer: Medicare Other

## 2020-10-24 DIAGNOSIS — S99912A Unspecified injury of left ankle, initial encounter: Secondary | ICD-10-CM

## 2020-10-24 DIAGNOSIS — M205X2 Other deformities of toe(s) (acquired), left foot: Secondary | ICD-10-CM

## 2020-10-24 DIAGNOSIS — Z9889 Other specified postprocedural states: Secondary | ICD-10-CM | POA: Diagnosis not present

## 2020-10-24 MED ORDER — OXYCODONE-ACETAMINOPHEN 5-325 MG PO TABS: 1.0000 | ORAL_TABLET | Freq: Four times a day (QID) | ORAL | 0 refills | Status: DC | PRN

## 2020-11-07 NOTE — Progress Notes (Signed)
   Subjective:  Patient presents today status post first MTPJ arthroplasty with implant left. DOS: 08/14/2020.  Patient states that over the past week he had an increase of pain secondary to a trip and fall injury that he sustained when he hurt the big toe and the lateral aspect of the ankle.  He states that it is painful to walk.  Past Medical History:  Diagnosis Date  . Bipolar 1 disorder (HCC)   . Hypertension   . Pancreatitis   . PTSD (post-traumatic stress disorder)       Objective/Physical Exam Neurovascular status intact.  Skin incisions appear to be well coapted and healed. No sign of infectious process noted. No dehiscence. No active bleeding noted.  Negative for any significant edema noted to the surgical extremity.  Radiographic exam Silastic implant is intact to the first MTPJ.  Good alignment of the first ray.  No new fractures or new evidence of osseous injury left foot and ankle  Assessment: 1. s/p first MTPJ arthroplasty with implant left. DOS: 08/14/2020 2.  Left ankle pain secondary to trip and fall injury   Plan of Care:  1. Patient was evaluated.  2.  Continue wearing good supportive stability sneakers 3.  Continue range of motion exercises to the first MTPJ. 4.  Refill prescription for Percocet 5/325 mg 5.  Continue physical therapy at [facility]  6.  Return to clinic in 6 weeks  Felecia Shelling, DPM [facility]  Dr. Felecia Shelling, DPM    [address] [place], [place] [address]                Office [phone]  Fax [phone]

## 2020-11-21 ENCOUNTER — Other Ambulatory Visit: Payer: Self-pay

## 2020-11-21 ENCOUNTER — Encounter: Payer: Self-pay | Admitting: Podiatry

## 2020-11-21 ENCOUNTER — Ambulatory Visit (INDEPENDENT_AMBULATORY_CARE_PROVIDER_SITE_OTHER): Payer: Medicare Other | Admitting: Podiatry

## 2020-11-21 DIAGNOSIS — M205X2 Other deformities of toe(s) (acquired), left foot: Secondary | ICD-10-CM

## 2020-11-21 DIAGNOSIS — G5792 Unspecified mononeuropathy of left lower limb: Secondary | ICD-10-CM | POA: Diagnosis not present

## 2020-11-21 MED ORDER — GABAPENTIN 100 MG PO CAPS: 100.0000 mg | ORAL_CAPSULE | Freq: Three times a day (TID) | ORAL | 1 refills | Status: AC

## 2020-11-21 MED ORDER — OXYCODONE-ACETAMINOPHEN 5-325 MG PO TABS: 1.0000 | ORAL_TABLET | Freq: Four times a day (QID) | ORAL | 0 refills | Status: DC | PRN

## 2020-11-21 NOTE — Progress Notes (Signed)
   Subjective:  Patient presents today status post first MTPJ arthroplasty with implant left. DOS: 08/14/2020.  Patient states that he does have good days and bad days.  He states that most recently he has been experiencing sharp shooting pains to the dorsum of the foot unrelated to the surgical area.  He presents for further treatment and evaluation  Past Medical History:  Diagnosis Date   Bipolar 1 disorder (HCC)    Hypertension    Pancreatitis    PTSD (post-traumatic stress disorder)       Objective/Physical Exam Neurovascular status intact.  Skin incisions appear to be well coapted and healed. No sign of infectious process noted. No dehiscence. No active bleeding noted.  Negative for any significant edema noted to the surgical extremity.  Mild tenderness to range of motion and palpation of the first MTPJ  Radiographic exam taken prior visit Silastic implant is intact to the first MTPJ.  Good alignment of the first ray.  No new fractures or new evidence of osseous injury left foot and ankle  Assessment: 1. s/p first MTPJ arthroplasty with implant left. DOS: 08/14/2020 2.  Left ankle pain secondary to trip and fall injury; resolved 3.  Suspect neuritis left foot   Plan of Care:  1. Patient was evaluated.  2.  Continue wearing good supportive stability sneakers 3.  Continue range of motion exercises to the first MTPJ. 4.  Refill prescription for Percocet 5/325 mg 5.  Prescription for gabapentin 100 mg 3 times daily 6.  Patient has not been able to initiate physical therapy yet.  He assures me that he will begin physical therapy soon.  He was having transportation issues at that time 7.  Return to clinic in 6 weeks For follow-up x-ray Felecia Shelling, DPM [facility]  Dr. Felecia Shelling, DPM    [address] [place], [place] [address]                Office [phone]  Fax [phone]

## 2021-01-02 ENCOUNTER — Encounter: Payer: Medicare Other | Admitting: Podiatry

## 2021-01-02 ENCOUNTER — Other Ambulatory Visit: Payer: Self-pay

## 2021-01-09 ENCOUNTER — Ambulatory Visit (INDEPENDENT_AMBULATORY_CARE_PROVIDER_SITE_OTHER): Payer: Medicare Other | Admitting: Podiatry

## 2021-01-09 ENCOUNTER — Other Ambulatory Visit: Payer: Self-pay

## 2021-01-09 ENCOUNTER — Ambulatory Visit (INDEPENDENT_AMBULATORY_CARE_PROVIDER_SITE_OTHER): Payer: Medicare Other

## 2021-01-09 DIAGNOSIS — M205X2 Other deformities of toe(s) (acquired), left foot: Secondary | ICD-10-CM

## 2021-01-09 DIAGNOSIS — G5792 Unspecified mononeuropathy of left lower limb: Secondary | ICD-10-CM

## 2021-01-09 MED ORDER — OXYCODONE-ACETAMINOPHEN 5-325 MG PO TABS: 1.0000 | ORAL_TABLET | Freq: Three times a day (TID) | ORAL | 0 refills | Status: DC | PRN

## 2021-01-13 NOTE — Progress Notes (Signed)
   Subjective:  Patient presents today status post first MTPJ arthroplasty with implant left. DOS: 08/14/2020.  Patient states that he does have good days and bad days.  He states that most recently he has been experiencing sharp shooting pains to the dorsum of the foot unrelated to the surgical area.  He presents for further treatment and evaluation  Past Medical History:  Diagnosis Date   Bipolar 1 disorder (HCC)    Hypertension    Pancreatitis    PTSD (post-traumatic stress disorder)       Objective/Physical Exam Neurovascular status intact.  Skin incisions appear to be well coapted and healed. No sign of infectious process noted. No dehiscence. No active bleeding noted.  Negative for any significant edema noted to the surgical extremity.  Mild tenderness to range of motion and palpation of the first MTPJ  Radiographic exam taken prior visit Silastic implant is intact to the first MTPJ.  Good alignment of the first ray.  No new fractures or new evidence of osseous injury left foot and ankle  Assessment: 1. s/p first MTPJ arthroplasty with implant left. DOS: 08/14/2020 2.  Left ankle pain secondary to trip and fall injury; resolved 3.  Suspect neuritis left foot   Plan of Care:  1. Patient was evaluated.  2.  Continue wearing good supportive stability sneakers 3.  Continue range of motion exercises to the first MTPJ. 4.  Refill prescription for Percocet 5/325 mg (no more refills will be provided) 5.  Prescription for gabapentin 100 mg 3 times daily 6.  Patient has not been able to initiate physical therapy yet.  He assures me that he will begin physical therapy soon.  He was having transportation issues at that time 7.  Return to clinic as needed   Felecia Shelling, DPM [facility]  Dr. Felecia Shelling, DPM    [address] [place], [place] [address]                Office [phone]  Fax [phone]

## 2021-11-13 ENCOUNTER — Ambulatory Visit (INDEPENDENT_AMBULATORY_CARE_PROVIDER_SITE_OTHER): Payer: Medicare Other

## 2021-11-13 ENCOUNTER — Ambulatory Visit (INDEPENDENT_AMBULATORY_CARE_PROVIDER_SITE_OTHER): Payer: Medicare Other | Admitting: Podiatry

## 2021-11-13 DIAGNOSIS — B351 Tinea unguium: Secondary | ICD-10-CM | POA: Diagnosis not present

## 2021-11-13 DIAGNOSIS — M2042 Other hammer toe(s) (acquired), left foot: Secondary | ICD-10-CM

## 2021-11-13 DIAGNOSIS — M79674 Pain in right toe(s): Secondary | ICD-10-CM | POA: Diagnosis not present

## 2021-11-13 DIAGNOSIS — M7751 Other enthesopathy of right foot: Secondary | ICD-10-CM

## 2021-11-13 MED ORDER — MELOXICAM 15 MG PO TABS: 15.0000 mg | ORAL_TABLET | Freq: Every day | ORAL | 1 refills | Status: DC

## 2021-11-24 MED ORDER — BETAMETHASONE SOD PHOS & ACET 6 (3-3) MG/ML IJ SUSP: 3.0000 mg | Freq: Once | INTRAMUSCULAR | Status: AC

## 2021-11-24 NOTE — Progress Notes (Signed)
   Subjective:  Patient presents today for evaluation of a new complaint of pain and tenderness associated to the right second digit.  Patient states that he has been experiencing pain with walking and tenderness to the right second toe.  He is also developed a thick toenail to the distal tip of the second toe and he would like to have it debrided today.  Past Medical History:  Diagnosis Date   Bipolar 1 disorder (HCC)    Hypertension    Pancreatitis    PTSD (post-traumatic stress disorder)       Objective/Physical Exam Neurovascular status intact.  There is pain on palpation of the second MTP joint of the right foot.  Hammertoe contracture also noted to the lesser digits of the right foot most exacerbated to the right second toe.  There is also thickened dystrophic discolored elongated toenail to the distal tip of the right second toe  Radiographic exam RT foot 11/13/2021 Silastic implant is intact to the first MTPJ.  Good alignment of the first ray.  No new fractures or new evidence of osseous injury left foot.  Hammertoe contracture deformity noted to the second digit right foot especially on oblique view  Assessment: 1. s/p first MTPJ arthroplasty with implant left. DOS: 08/14/2020 2.  Hammertoe second digit right 3.  Second MTP capsulitis right   Plan of Care:  1. Patient was evaluated.  2.  Mechanical debridement of the toenail to the right second digit was performed today using a nail nipper without incident or bleeding 3.  Injection of 0.5 cc Celestone Soluspan injected on the second MTP joint 4.  Prescription for meloxicam 15 mg daily 5.  Return to clinic in 3 weeks.  If the patient is not significantly better we may need to consider hammertoe surgery of the second digit   Felecia Shelling, DPM [facility]  Dr. Felecia Shelling, DPM    [address] [place], [place] [address]                Office [phone]  Fax [phone]

## 2021-12-04 ENCOUNTER — Ambulatory Visit (INDEPENDENT_AMBULATORY_CARE_PROVIDER_SITE_OTHER): Payer: Medicare Other | Admitting: Podiatry

## 2021-12-04 DIAGNOSIS — M2041 Other hammer toe(s) (acquired), right foot: Secondary | ICD-10-CM | POA: Diagnosis not present

## 2021-12-10 ENCOUNTER — Telehealth: Payer: Self-pay | Admitting: Urology

## 2021-12-10 NOTE — Telephone Encounter (Signed)
DOS - 01/07/22  HAMMERTOE REPAIR 2 - 5 RIGHT --- 61607 CAPSULOTOMY 2ND RIGHT --- 28270  [facility] EFFECTIVE DATE - 06/14/21  PLAN DEDUCTIBLE - $0.00 OUT OF POCKET - $8,300.00 W/ $8,259.69 REMAINING COINSURANCE - 20% COPAY - $0.00   PER [employer] WEBSITE FOR CPT CODES 37106 AND [id number] Notification or Prior Authorization is not required for the requested services  This [facility] Advant[employer] members plan does not currently require a prior authorization for these services. If you have general questions about the prior authorization requirements, please call us at [phone] or visit [email or web address] > Clinician Resources > Advance and Admission Notification Requirements. The number above acknowledges your notification. Please write this number down for future reference. Notification is not a guarantee of coverage or payment.  Decision ID #:[id number]

## 2022-01-14 ENCOUNTER — Encounter: Payer: Medicare Other | Admitting: Podiatry

## 2022-01-15 ENCOUNTER — Encounter: Payer: Medicare Other | Admitting: Podiatry

## 2022-01-21 ENCOUNTER — Other Ambulatory Visit: Payer: Self-pay | Admitting: Podiatry

## 2022-01-21 DIAGNOSIS — M2041 Other hammer toe(s) (acquired), right foot: Secondary | ICD-10-CM | POA: Diagnosis not present

## 2022-01-21 MED ORDER — OXYCODONE-ACETAMINOPHEN 5-325 MG PO TABS: 1.0000 | ORAL_TABLET | ORAL | 0 refills | Status: DC | PRN

## 2022-01-21 MED ORDER — MELOXICAM 15 MG PO TABS: 15.0000 mg | ORAL_TABLET | Freq: Every day | ORAL | 1 refills | Status: DC

## 2022-01-21 NOTE — Progress Notes (Signed)
PRN postop 

## 2022-01-22 ENCOUNTER — Encounter: Payer: Medicare Other | Admitting: Podiatry

## 2022-01-22 ENCOUNTER — Telehealth: Payer: Self-pay | Admitting: Podiatry

## 2022-01-22 NOTE — Telephone Encounter (Signed)
Tried calling patient.  No answer.  Left voicemail to take 2 tablets Percocet 5/325 mg stat, then 1 tablet every 4 hours.  Continue the meloxicam 15 mg daily.  He may also discontinue meloxicam and take ibuprofen 800 mg 3 times daily.  Continue rest elevation and ice.  If patient calls back please notify him of these recommendations.  Thanks, Dr. Logan Bores

## 2022-01-22 NOTE — Telephone Encounter (Signed)
Patient called and stated that his foot is in severe pain. The pain meds that was prescribed are not working.  Please advise

## 2022-01-25 ENCOUNTER — Other Ambulatory Visit: Payer: Self-pay | Admitting: Podiatry

## 2022-01-25 MED ORDER — OXYCODONE-ACETAMINOPHEN 10-325 MG PO TABS: 1.0000 | ORAL_TABLET | ORAL | 0 refills | Status: DC | PRN

## 2022-01-25 MED ORDER — IBUPROFEN 800 MG PO TABS: 800.0000 mg | ORAL_TABLET | Freq: Three times a day (TID) | ORAL | 1 refills | Status: AC

## 2022-01-25 NOTE — Telephone Encounter (Signed)
Noted, thanks!

## 2022-01-25 NOTE — Telephone Encounter (Signed)
Spoke to patient on phone.  Changed his medication postop regimen. Will see at next f/u appt this Friday 01/29/2022

## 2022-01-25 NOTE — Telephone Encounter (Signed)
Patient called back and he stated that he has taken 2-3 Percocet at one time. Patient has also taken the Meloxicam 3 hours later. He is still in pain and can not sleep. He also have been resting,icing and elevating.  Please call patient

## 2022-01-25 NOTE — Progress Notes (Signed)
PRN postop  Patient called in with increased postop pain. Rx Percocet 10/325mg  Q4H PRN pain. DC Percocet 5/325 mg. Rx Motrin 800 mg TID. DC meloxicam 15 mg.   Rest ice and elevation.  Minimal weightbearing. POV scheduled for 01/29/2022.  Felecia Shelling, DPM [facility]  Dr. Felecia Shelling, DPM    [address] [place], [place] [address]                Office [phone]  Fax [phone]

## 2022-01-27 ENCOUNTER — Encounter: Payer: Self-pay | Admitting: Emergency Medicine

## 2022-01-27 ENCOUNTER — Other Ambulatory Visit: Payer: Self-pay

## 2022-01-27 DIAGNOSIS — Z5321 Procedure and treatment not carried out due to patient leaving prior to being seen by health care provider: Secondary | ICD-10-CM | POA: Diagnosis not present

## 2022-01-27 DIAGNOSIS — K1230 Oral mucositis (ulcerative), unspecified: Secondary | ICD-10-CM | POA: Insufficient documentation

## 2022-01-27 NOTE — ED Triage Notes (Signed)
Patient ambulatory to triage with steady gait; reports for last month has had an oral ulcer that bleeds when he eats or brushes his teeth; st has seen dentist and rx mouthwash but it persists

## 2022-01-28 ENCOUNTER — Emergency Department
Admission: EM | Admit: 2022-01-28 | Discharge: 2022-01-28 | Discharge status: Against Medical Advice | Payer: Medicare Other | Attending: Emergency Medicine | Admitting: Emergency Medicine

## 2022-01-28 NOTE — ED Notes (Signed)
No answer when called several times from lobby 

## 2022-01-29 ENCOUNTER — Encounter: Payer: Medicare Other | Admitting: Podiatry

## 2022-02-05 ENCOUNTER — Ambulatory Visit (INDEPENDENT_AMBULATORY_CARE_PROVIDER_SITE_OTHER): Payer: Medicare Other | Admitting: Podiatry

## 2022-02-05 ENCOUNTER — Encounter: Payer: Medicare Other | Admitting: Podiatry

## 2022-02-05 ENCOUNTER — Ambulatory Visit (INDEPENDENT_AMBULATORY_CARE_PROVIDER_SITE_OTHER): Payer: Medicare Other

## 2022-02-05 DIAGNOSIS — Z9889 Other specified postprocedural states: Secondary | ICD-10-CM

## 2022-02-05 DIAGNOSIS — M2041 Other hammer toe(s) (acquired), right foot: Secondary | ICD-10-CM

## 2022-02-05 MED ORDER — OXYCODONE-ACETAMINOPHEN 10-325 MG PO TABS
1.0000 | ORAL_TABLET | ORAL | 0 refills | Status: DC | PRN
Start: 2022-02-05 — End: 2022-02-10

## 2022-02-05 NOTE — Progress Notes (Signed)
   Chief Complaint  Patient presents with   Routine Post Op      POV #1 DOS 01/21/2022 HAMMERTOE ARTHROPLASTY 2-5 RT FOOT, POSS 2ND TOE CAPSULOTOMY RT FOOT    Subjective:  Patient presents today status post hammertoe repair digits 2-5 right foot. DOS: 01/21/2022.  Patient states that his foot is doing well.  He continues to have pain which is controlled with the pain medication.  No new complaints at this time  Past Medical History:  Diagnosis Date   Bipolar 1 disorder (HCC)    Hypertension    Pancreatitis    PTSD (post-traumatic stress disorder)     Past Surgical History:  Procedure Laterality Date   CHOLECYSTECTOMY     PANCREAS SURGERY      No Known Allergies  Objective/Physical Exam Neurovascular status intact.  Skin incisions appear to be well coapted with sutures  intact. No sign of infectious process noted. No dehiscence. No active bleeding noted.  Minimal edema noted to the surgical extremity.  Radiographic Exam:  Osteotomies and percutaneous fixation pins appear to be stable with routine healing with the exception of the percutaneous fixation pin to the third digit of the surgical foot has backed out significantly.  Good overall alignment of the toes  Assessment: 1. s/p hammertoe repair digits 2-5 right. DOS: 01/21/2022   Plan of Care:  1. Patient was evaluated. X-rays reviewed 2.  The percutaneous pin to the third digit of the surgical foot was removed today. 3.  Dressings applied.  Clean dry and intact x1 week 4.  Continue minimal weightbearing in the cam boot 5.  Refill prescription for Percocet 5/325 mg every 4 hours #30 6.  Return to clinic 1 week for suture removal   Felecia Shelling, DPM [facility]  Dr. Felecia Shelling, DPM    [address] [place], [place] [address]                Office [phone]  Fax [phone]

## 2022-02-10 ENCOUNTER — Other Ambulatory Visit: Payer: Self-pay | Admitting: Podiatry

## 2022-02-11 ENCOUNTER — Telehealth: Payer: Self-pay | Admitting: Podiatry

## 2022-02-11 MED ORDER — OXYCODONE-ACETAMINOPHEN 10-325 MG PO TABS
1.0000 | ORAL_TABLET | Freq: Four times a day (QID) | ORAL | 0 refills | Status: DC | PRN
Start: 2022-02-11 — End: 2022-02-19

## 2022-02-11 NOTE — Telephone Encounter (Signed)
Refill sent. - Dr. Talisha Erby

## 2022-02-11 NOTE — Telephone Encounter (Signed)
Pt called in requesting a refill on RX for Oxycodone; he stated that he is still in pain. Please advise.

## 2022-02-16 ENCOUNTER — Other Ambulatory Visit: Payer: Self-pay | Admitting: Podiatry

## 2022-02-16 ENCOUNTER — Ambulatory Visit (INDEPENDENT_AMBULATORY_CARE_PROVIDER_SITE_OTHER): Payer: Medicare Other | Admitting: Podiatry

## 2022-02-16 DIAGNOSIS — Z9889 Other specified postprocedural states: Secondary | ICD-10-CM

## 2022-02-16 DIAGNOSIS — M2041 Other hammer toe(s) (acquired), right foot: Secondary | ICD-10-CM

## 2022-02-16 NOTE — Progress Notes (Signed)
   Chief Complaint  Patient presents with   Routine Post Op    Subjective:  Patient presents today status post hammertoe repair digits 2-5 right foot. DOS: 01/21/2022.  Patient states that his foot is doing well.  He does not have any pain.  He is here to have his stitches removed.  He is known to Dr. Logan Bores.  Past Medical History:  Diagnosis Date   Bipolar 1 disorder (HCC)    Hypertension    Pancreatitis    PTSD (post-traumatic stress disorder)     Past Surgical History:  Procedure Laterality Date   CHOLECYSTECTOMY     PANCREAS SURGERY      No Known Allergies  Objective/Physical Exam Neurovascular status intact.  Skin incisions appear to be well coapted with sutures  intact. No sign of infectious process noted. No dehiscence. No active bleeding noted.  Minimal edema noted to the surgical extremity.  Radiographic Exam:  Osteotomies and percutaneous fixation pins appear to be stable with routine healing with t mild backing out of fifth pain noted as well.  However left intact.  Good overall alignment of the toes  Assessment: 1. s/p hammertoe repair digits 2-5 right. DOS: 01/21/2022   Plan of Care:  1. Patient was evaluated. X-rays reviewed 2..  Removed.  No signs of [place] is noted no complication noted. 3.  Continue minimal weightbearing in the cam boot 4.  Refill prescription for Percocet 5/325 mg every 4 hours #30 5.  Return to clinic 1 week for pin removal.  With Dr. Logan Bores

## 2022-02-16 NOTE — Telephone Encounter (Signed)
Patient is calling because after his visit today , foot is burning and hurting w/ throbbing pain,pills not helping,had his stitches removed today, please advise.

## 2022-02-18 ENCOUNTER — Other Ambulatory Visit: Payer: Self-pay | Admitting: Podiatry

## 2022-02-18 ENCOUNTER — Telehealth: Payer: Self-pay | Admitting: Podiatry

## 2022-02-18 NOTE — Telephone Encounter (Signed)
Pt is requesting pain medication for his foot. He states he is in a lot of pain. He had sx to his right foot on 01/21/22 and recently had his stitches removed but the pins are still in place.  [id number] - [place] (N), [place] - [address]  Please advise.

## 2022-02-19 ENCOUNTER — Ambulatory Visit (INDEPENDENT_AMBULATORY_CARE_PROVIDER_SITE_OTHER): Payer: Medicare Other | Admitting: Podiatry

## 2022-02-19 ENCOUNTER — Other Ambulatory Visit: Payer: Self-pay | Admitting: Podiatry

## 2022-02-19 ENCOUNTER — Ambulatory Visit (INDEPENDENT_AMBULATORY_CARE_PROVIDER_SITE_OTHER): Payer: Medicare Other

## 2022-02-19 DIAGNOSIS — Z9889 Other specified postprocedural states: Secondary | ICD-10-CM

## 2022-02-19 MED ORDER — OXYCODONE-ACETAMINOPHEN 10-325 MG PO TABS: 1.0000 | ORAL_TABLET | Freq: Four times a day (QID) | ORAL | 0 refills | Status: DC | PRN

## 2022-02-19 NOTE — Progress Notes (Signed)
   Chief Complaint  Patient presents with   Post-op Follow-up    POV #3 DOS 01/21/2022 HAMMERTOE ARTHROPLASTY 2-5 RT FOOT, POSS 2ND TOE CAPSULOTOMY RT FOOT    Subjective:  Patient presents today status post hammertoe repair digits 2-5 right foot. DOS: 01/21/2022.  Patient continues to have tenderness to the foot.  He has been weightbearing in the cam boot as instructed.  Past Medical History:  Diagnosis Date   Bipolar 1 disorder (HCC)    Hypertension    Pancreatitis    PTSD (post-traumatic stress disorder)     Past Surgical History:  Procedure Laterality Date   CHOLECYSTECTOMY     PANCREAS SURGERY      No Known Allergies  Objective/Physical Exam Neurovascular status intact.  Skin incisions healed.  No sign of infectious process noted. No dehiscence. No active bleeding noted.  Negative for any significant edema noted to the surgical extremity.  Radiographic Exam 02/19/2022 RT foot:  Essentially unchanged from prior x-rays with the exception of the percutaneous fixation pin to the third digit has been removed.  Good overall alignment of the toes  Assessment: 1. s/p hammertoe repair digits 2-5 right. DOS: 01/21/2022   Plan of Care:  1. Patient was evaluated. X-rays reviewed 2.  Continue weightbearing in the cam boot 3.  Refill for Percocet 5/325 mg every 6 hours as needed pain 4.  Return to clinic 2 weeks for follow-up x-ray and removal of the remaining percutaneous pins   Felecia Shelling, DPM [facility]  Dr. Felecia Shelling, DPM    [address] [place], [place] [address]                Office [phone]  Fax [phone]

## 2022-02-24 ENCOUNTER — Encounter: Payer: Medicare Other | Admitting: Podiatry

## 2022-02-26 ENCOUNTER — Other Ambulatory Visit: Payer: Self-pay | Admitting: Podiatry

## 2022-02-26 ENCOUNTER — Telehealth: Payer: Self-pay | Admitting: Podiatry

## 2022-02-26 NOTE — Telephone Encounter (Signed)
Pt called in to request a refill on Rx for Oxycodone. [facility] uses [facility] [address]. Please advise.

## 2022-03-01 ENCOUNTER — Telehealth: Payer: Self-pay | Admitting: Podiatry

## 2022-03-01 ENCOUNTER — Other Ambulatory Visit: Payer: Self-pay | Admitting: Podiatry

## 2022-03-01 DIAGNOSIS — Z9889 Other specified postprocedural states: Secondary | ICD-10-CM

## 2022-03-01 MED ORDER — OXYCODONE-ACETAMINOPHEN 5-325 MG PO TABS: 1.0000 | ORAL_TABLET | Freq: Four times a day (QID) | ORAL | 0 refills | Status: DC | PRN

## 2022-03-01 NOTE — Telephone Encounter (Signed)
Patient is calling for a pain medicine refill, not able to sleep.  Please advise. Patient has been notified that pain medicine has been sent to pharmacy on file.

## 2022-03-01 NOTE — Telephone Encounter (Signed)
Pt called in requesting refill on pain Rx

## 2022-03-01 NOTE — Progress Notes (Signed)
As needed postop 

## 2022-03-01 NOTE — Telephone Encounter (Signed)
Patient called back to follow up on pain medication request

## 2022-03-01 NOTE — Telephone Encounter (Signed)
Refill prescription sent to the pharmacy.  Please notify patient that I decreased the dosage from 10 mg Percocet to 5 mg Percocet.  We need to start tapering him down off of the pain medication.  Thanks, Dr. Amalia Hailey

## 2022-03-01 NOTE — Telephone Encounter (Signed)
Pt is asking for a refill of his Rx. Pt stated he is in a lot of pain. Foot feels like its burning and throbbing.  Please advise

## 2022-03-05 ENCOUNTER — Ambulatory Visit (INDEPENDENT_AMBULATORY_CARE_PROVIDER_SITE_OTHER): Payer: Medicare Other | Admitting: Podiatry

## 2022-03-05 ENCOUNTER — Ambulatory Visit (INDEPENDENT_AMBULATORY_CARE_PROVIDER_SITE_OTHER): Payer: Medicare Other

## 2022-03-05 DIAGNOSIS — Z9889 Other specified postprocedural states: Secondary | ICD-10-CM

## 2022-03-05 MED ORDER — OXYCODONE-ACETAMINOPHEN 5-325 MG PO TABS: 1.0000 | ORAL_TABLET | Freq: Four times a day (QID) | ORAL | 0 refills | Status: DC | PRN

## 2022-03-05 NOTE — Progress Notes (Signed)
   Chief Complaint  Patient presents with   Post-op Follow-up    Patient is here for post visit for right foot  DOS 01/21/22 , patient states that he is having some burning.    Subjective:  Patient presents today status post hammertoe repair digits 2-5 right foot. DOS: 01/21/2022.  Patient doing well.  Weightbearing in the cam boot as instructed.  No new complaints at this time  Past Medical History:  Diagnosis Date   Bipolar 1 disorder ([place])    Hypertension    Pancreatitis    PTSD (post-traumatic stress disorder)     Past Surgical History:  Procedure Laterality Date   CHOLECYSTECTOMY     PANCREAS SURGERY      No Known Allergies  Objective/Physical Exam Neurovascular status intact.  Skin incisions healed.  No sign of infectious process noted.  There is some maceration between the digits noted.  No edema or erythema.  Radiographic Exam 03/05/2022 RT foot:  Essentially unchanged from prior x-rays with the exception of the percutaneous fixation pin to the third digit has been removed.  Good overall alignment of the toes  Assessment: 1. s/p hammertoe repair digits 2-5 right. DOS: 01/21/2022   Plan of Care:  1. Patient was evaluated. X-rays reviewed.  Remaining percutaneous fixation pins removed 2.  Patient may begin to transition out of the cam boot into good supportive shoes and sneakers 3.  Refill for Percocet 5/325 mg every 6 hours as needed pain 4.  There is some mild maceration between the digits of the surgical foot.  Recommend Betadine between the toes daily  5.  Return to clinic 4 weeks   Edrick Kins, DPM [facility]  Dr. Edrick Kins, DPM    [address] [place], [place] [address]                Office [phone]  Fax [phone]

## 2022-03-11 ENCOUNTER — Other Ambulatory Visit: Payer: Self-pay | Admitting: Podiatry

## 2022-03-11 MED ORDER — OXYCODONE-ACETAMINOPHEN 5-325 MG PO TABS: 1.0000 | ORAL_TABLET | Freq: Four times a day (QID) | ORAL | 0 refills | Status: DC | PRN

## 2022-03-11 NOTE — Progress Notes (Signed)
PRN postop 

## 2022-03-15 ENCOUNTER — Telehealth: Payer: Self-pay | Admitting: Podiatry

## 2022-03-15 ENCOUNTER — Other Ambulatory Visit: Payer: Self-pay | Admitting: Podiatry

## 2022-03-15 NOTE — Telephone Encounter (Signed)
Patient said that his foot is still swelling , cannot wear his shoe, needing another prescription for pain medicine.(Oxycodone-ace). Please advise.

## 2022-03-15 NOTE — Telephone Encounter (Signed)
Pt called in to request a refill on his Rx for pain. Please advise.

## 2022-03-15 NOTE — Telephone Encounter (Signed)
Pt stated hes in pain and would like a refill of his Rx.  Please Advise

## 2022-03-16 MED ORDER — OXYCODONE-ACETAMINOPHEN 5-325 MG PO TABS: 1.0000 | ORAL_TABLET | Freq: Four times a day (QID) | ORAL | 0 refills | Status: DC | PRN

## 2022-03-16 NOTE — Telephone Encounter (Signed)
Refill sent. - Dr. Amalia Hailey

## 2022-03-16 NOTE — Telephone Encounter (Signed)
Patient is calling again about his foot swelling and he is needing some pain meds.  Please advise

## 2022-03-24 ENCOUNTER — Encounter: Payer: Self-pay | Admitting: Podiatry

## 2022-03-24 ENCOUNTER — Other Ambulatory Visit: Payer: Self-pay | Admitting: Podiatry

## 2022-03-25 ENCOUNTER — Telehealth: Payer: Self-pay | Admitting: Podiatry

## 2022-03-25 ENCOUNTER — Telehealth: Payer: Self-pay | Admitting: *Deleted

## 2022-03-25 NOTE — Telephone Encounter (Signed)
Pt called to request a refill on Rx for pain. Please advise.

## 2022-03-26 NOTE — Telephone Encounter (Signed)
Please advise 

## 2022-03-27 MED ORDER — OXYCODONE-ACETAMINOPHEN 5-325 MG PO TABS: 1.0000 | ORAL_TABLET | Freq: Three times a day (TID) | ORAL | 0 refills | Status: DC | PRN

## 2022-03-27 NOTE — Telephone Encounter (Signed)
Refill sent.  We are tapering the patient down from the percocet. I refilled for Q8H #21. Thanks, Dr. Amalia Hailey

## 2022-04-02 ENCOUNTER — Ambulatory Visit (INDEPENDENT_AMBULATORY_CARE_PROVIDER_SITE_OTHER): Payer: Medicare Other | Admitting: Podiatry

## 2022-04-02 DIAGNOSIS — Z9889 Other specified postprocedural states: Secondary | ICD-10-CM

## 2022-04-02 MED ORDER — OXYCODONE-ACETAMINOPHEN 5-325 MG PO TABS: 1.0000 | ORAL_TABLET | Freq: Three times a day (TID) | ORAL | 0 refills | Status: AC | PRN

## 2022-04-03 NOTE — Telephone Encounter (Signed)
error 

## 2022-04-05 NOTE — Progress Notes (Signed)
   Chief Complaint  Patient presents with   Post-op Follow-up    Patient is here for post op visit DOS 01/21/22, patient states that he is still having pain when he puts on a shoe on right foot.patient needs rx refill on pain medication.    Subjective:  Patient presents today status post hammertoe repair digits 2-5 right foot. DOS: 01/21/2022.  Patient doing well although he continues to have some pain and tenderness associated to the foot.  He is now transitioning out of the cam boot.  No new complaints at this time  Past Medical History:  Diagnosis Date   Bipolar 1 disorder ([place])    Hypertension    Pancreatitis    PTSD (post-traumatic stress disorder)     Past Surgical History:  Procedure Laterality Date   CHOLECYSTECTOMY     PANCREAS SURGERY      No Known Allergies  Objective/Physical Exam Neurovascular status intact.  Skin incisions healed.  Good alignment of the toes.  No maceration noted.  Overall well-healing surgical foot.  Radiographic Exam 03/05/2022 RT foot:  Essentially unchanged from prior x-rays with the exception of the percutaneous fixation pin to the third digit has been removed.  Good overall alignment of the toes  Assessment: 1. s/p hammertoe repair digits 2-5 right. DOS: 01/21/2022   Plan of Care:  1. Patient was evaluated. X-rays reviewed.  2.  Overall the toes appear to be nice and rectus with good alignment and good healing of the forefoot.  Despite this but the patient continues to have some pain throughout the forefoot. 3.  Continue wearing good supportive shoes and sneakers 4.  Final prescription for Percocet 5/325 mg as needed pain 5.  Return to clinic 3 months   Edrick Kins, DPM [facility]  Dr. Edrick Kins, DPM    [address] [place], [place] [address]                Office [phone]  Fax [phone]

## 2022-04-06 ENCOUNTER — Encounter: Payer: Medicare Other | Admitting: Podiatry

## 2022-05-05 ENCOUNTER — Encounter: Payer: Self-pay | Admitting: Podiatry

## 2022-05-05 ENCOUNTER — Encounter: Payer: Self-pay | Admitting: Internal Medicine

## 2022-05-19 ENCOUNTER — Other Ambulatory Visit: Payer: Self-pay | Admitting: Internal Medicine

## 2022-05-19 DIAGNOSIS — R1011 Right upper quadrant pain: Secondary | ICD-10-CM

## 2022-05-19 DIAGNOSIS — R748 Abnormal levels of other serum enzymes: Secondary | ICD-10-CM

## 2022-05-19 DIAGNOSIS — K861 Other chronic pancreatitis: Secondary | ICD-10-CM

## 2022-06-01 ENCOUNTER — Other Ambulatory Visit: Payer: Medicare Other

## 2022-06-17 ENCOUNTER — Other Ambulatory Visit: Payer: Medicare Other

## 2022-07-02 ENCOUNTER — Ambulatory Visit: Payer: Medicare Other | Admitting: Podiatry
